# Patient Record
Sex: Female | Born: 1935 | Race: White | Hispanic: No | Smoking: Never smoker
Health system: Southern US, Community
[De-identification: ages and names within clinical notes are randomized; demographics above are authoritative.]

## PROBLEM LIST (undated history)

## (undated) DIAGNOSIS — I10 Essential (primary) hypertension: Secondary | ICD-10-CM

## (undated) DIAGNOSIS — E785 Hyperlipidemia, unspecified: Secondary | ICD-10-CM

## (undated) DIAGNOSIS — E079 Disorder of thyroid, unspecified: Secondary | ICD-10-CM

## (undated) HISTORY — PX: ABDOMINAL HYSTERECTOMY: SHX81

## (undated) HISTORY — PX: HERNIA REPAIR: SHX51

## (undated) HISTORY — PX: CORONARY ANGIOPLASTY WITH STENT PLACEMENT: SHX49

---

## 2015-08-14 ENCOUNTER — Emergency Department
Admission: EM | Admit: 2015-08-14 | Discharge: 2015-08-14 | Disposition: A | Discharge status: Home / Self Care | Payer: Medicare HMO | Attending: Emergency Medicine | Admitting: Emergency Medicine

## 2015-08-14 DIAGNOSIS — S81811A Laceration without foreign body, right lower leg, initial encounter: Secondary | ICD-10-CM | POA: Insufficient documentation

## 2015-08-14 DIAGNOSIS — S81851A Open bite, right lower leg, initial encounter: Secondary | ICD-10-CM | POA: Diagnosis present

## 2015-08-14 DIAGNOSIS — E785 Hyperlipidemia, unspecified: Secondary | ICD-10-CM | POA: Insufficient documentation

## 2015-08-14 DIAGNOSIS — Z23 Encounter for immunization: Secondary | ICD-10-CM | POA: Insufficient documentation

## 2015-08-14 DIAGNOSIS — Y929 Unspecified place or not applicable: Secondary | ICD-10-CM | POA: Diagnosis not present

## 2015-08-14 DIAGNOSIS — I10 Essential (primary) hypertension: Secondary | ICD-10-CM | POA: Insufficient documentation

## 2015-08-14 DIAGNOSIS — IMO0002 Reserved for concepts with insufficient information to code with codable children: Secondary | ICD-10-CM

## 2015-08-14 DIAGNOSIS — Y939 Activity, unspecified: Secondary | ICD-10-CM | POA: Insufficient documentation

## 2015-08-14 DIAGNOSIS — W540XXA Bitten by dog, initial encounter: Secondary | ICD-10-CM | POA: Insufficient documentation

## 2015-08-14 DIAGNOSIS — Y999 Unspecified external cause status: Secondary | ICD-10-CM | POA: Diagnosis not present

## 2015-08-14 HISTORY — DX: Hyperlipidemia, unspecified: E78.5

## 2015-08-14 HISTORY — DX: Disorder of thyroid, unspecified: E07.9

## 2015-08-14 HISTORY — DX: Essential (primary) hypertension: I10

## 2015-08-14 MED ORDER — TETANUS-DIPHTH-ACELL PERTUSSIS 5-2.5-18.5 LF-MCG/0.5 IM SUSP
0.5000 mL | Freq: Once | INTRAMUSCULAR | Status: AC
  Administered 2015-08-14: 0.5 mL via INTRAMUSCULAR
  Filled 2015-08-14: qty 0.5

## 2015-08-14 MED ORDER — AMOXICILLIN-POT CLAVULANATE 500-125 MG PO TABS
1.0000 | ORAL_TABLET | Freq: Once | ORAL | Status: AC
  Administered 2015-08-14: 500 mg via ORAL
  Filled 2015-08-14: qty 1

## 2015-08-14 MED ORDER — BACITRACIN ZINC 500 UNIT/GM EX OINT
TOPICAL_OINTMENT | Freq: Once | CUTANEOUS | Status: AC
  Administered 2015-08-14: 1 via TOPICAL

## 2015-08-14 MED ORDER — BACITRACIN ZINC 500 UNIT/GM EX OINT
TOPICAL_OINTMENT | CUTANEOUS | Status: AC
  Administered 2015-08-14: 1 via TOPICAL
  Filled 2015-08-14: qty 0.9

## 2015-08-14 MED ORDER — AMOXICILLIN-POT CLAVULANATE 500-125 MG PO TABS: 500.0000 mg | ORAL_TABLET | Freq: Three times a day (TID) | ORAL | Status: DC

## 2015-08-14 NOTE — Discharge Instructions (Signed)
Continue to change dressing at least daily and follow up with your primary physician early next week for further evaluation. Return to the emergency room for any concerns. Watch for signs of infection.

## 2015-08-14 NOTE — ED Notes (Signed)
Pt states she was bitten by her grand daughters dog on the back of the right ankle/lower leg today..Marland Kitchen

## 2015-08-14 NOTE — ED Provider Notes (Signed)
[facility] Emergency Department Provider Note  ____________________________________________  Time seen: Approximately 7:52 PM  I have reviewed the triage vital signs and the nursing notes.   HISTORY  Chief Complaint Animal Bite    HPI Donna Montes is a 81 y.o. female who was bitten by a family dog 2 hours ago in the back of her right leg. The dog is up-to-date on shots. She has mild pain. Does not remember her last tetanus. She lives in [place]. Does not have diabetes.   Past Medical History  Diagnosis Date  . Hypertension   . Hyperlipidemia   . Thyroid disease     There are no active problems to display for this patient.   Past Surgical History  Procedure Laterality Date  . Coronary angioplasty with stent placement    . Abdominal hysterectomy      Current Outpatient Rx  Name  Route  Sig  Dispense  Refill  . amoxicillin-clavulanate (AUGMENTIN) 500-125 MG tablet   Oral   Take 1 tablet (500 mg total) by mouth 3 (three) times daily.   21 tablet   0     Allergies Codeine  No family history on file.  Social History Social History  Substance Use Topics  . Smoking status: Never Smoker   . Smokeless tobacco: None  . Alcohol Use: No    Review of Systems Constitutional: No fever/chills Eyes: No visual changes. ENT: No sore throat. Cardiovascular: Denies chest pain. Respiratory: Denies shortness of breath. Gastrointestinal: No abdominal pain.   Skin: per hpi Neurological: Negative for headaches, focal weakness or numbness. 10-point ROS otherwise negative.  ____________________________________________   PHYSICAL EXAM:  VITAL SIGNS: ED Triage Vitals  Enc Vitals Group     BP 08/14/15 1822 159/77 mmHg     Pulse Rate 08/14/15 1822 67     Resp 08/14/15 1822 18     Temp 08/14/15 1822 97.7 F (36.5 C)     Temp Source 08/14/15 1822 Oral     SpO2 08/14/15 1822 96 %     Weight 08/14/15 1822 163 lb (73.936 kg)     Height 08/14/15  1822  (1.6 m)     Head Cir --      Peak Flow --      Pain Score 08/14/15 1900 1     Pain Loc --      Pain Edu? --      Excl. in GC? --     Constitutional: Alert and oriented. Well appearing and in no acute distress. Eyes: Conjunctivae are normal. Mouth/Throat: Mucous membranes are moist.  Cardiovascular: Normal rate, regular rhythm. Grossly normal heart sounds.  Good peripheral circulation. Respiratory: Normal respiratory effort.  No retractions. Lungs CTAB. Neurologic:  Normal speech and language. No gross focal neurologic deficits are appreciated. No gait instability. Skin:  Skin is warm, dry and intact. 2 x 1 cm wound to the right posterior distal shin. Some tenderness and subq fat visualized. No bleeding. Psychiatric: Mood and affect are normal. Speech and behavior are normal.  ____________________________________________   LABS (all labs ordered are listed, but only abnormal results are displayed)  Labs Reviewed - No data to display ____________________________________________  EKG   ____________________________________________  RADIOLOGY   ____________________________________________   PROCEDURES  Procedure(s) performed: Because of the width of the wound, closure was not attempted. Will need to heal by secondary closure. Area cleansed with saline, covered with bacitracin and Vaseline gauze. Also wrapped with dry gauze and Coban.  Critical Care performed: No  ____________________________________________   INITIAL IMPRESSION / ASSESSMENT AND PLAN / ED COURSE  Pertinent labs & imaging results that were available during my care of the patient were reviewed by me and considered in my medical decision making (see chart for details).  81 year old female presents with a dog bite, family pet, up-to-date on immunizations. Wound to the right posterior lower extremity. Wound left open as suturing was not possible. Given tetanus shot. Started on Augmentin. Encouraged  daily dressing changes. In follow-up with her physician early next week for further evaluation. Her neighbor is a Engineer, civil (consulting)nurse who can help with dressing changes. Discussed signs of infection to watch for. ____________________________________________   FINAL CLINICAL IMPRESSION(S) / ED DIAGNOSES  Final diagnoses:  Dog bite  Laceration      Ignacia BayleyRobert Wymon Swaney, PA-C 08/14/15 2016  Jeanmarie PlantJames A McShane, MD 08/14/15 [phone]

## 2015-08-14 NOTE — ED Notes (Signed)
Pt has 1" bite to posterior aspect of R leg. Pt sts dog's shots up to date.

## 2015-08-14 NOTE — ED Notes (Signed)
E sig pad not working, pt verbalized understanding 

## 2018-12-31 ENCOUNTER — Emergency Department: Payer: Medicare HMO

## 2018-12-31 ENCOUNTER — Inpatient Hospital Stay
Admission: EM | Admit: 2018-12-31 | Discharge: 2019-01-03 | DRG: 368 | Disposition: A | Discharge status: Home Health | Payer: Medicare HMO | Attending: Specialist | Admitting: Specialist

## 2018-12-31 ENCOUNTER — Encounter: Payer: Self-pay | Admitting: Emergency Medicine

## 2018-12-31 ENCOUNTER — Other Ambulatory Visit: Payer: Self-pay

## 2018-12-31 DIAGNOSIS — Z955 Presence of coronary angioplasty implant and graft: Secondary | ICD-10-CM | POA: Diagnosis not present

## 2018-12-31 DIAGNOSIS — Z79899 Other long term (current) drug therapy: Secondary | ICD-10-CM | POA: Diagnosis not present

## 2018-12-31 DIAGNOSIS — I5032 Chronic diastolic (congestive) heart failure: Secondary | ICD-10-CM | POA: Diagnosis present

## 2018-12-31 DIAGNOSIS — D649 Anemia, unspecified: Secondary | ICD-10-CM | POA: Diagnosis present

## 2018-12-31 DIAGNOSIS — Z7983 Long term (current) use of bisphosphonates: Secondary | ICD-10-CM | POA: Diagnosis not present

## 2018-12-31 DIAGNOSIS — R109 Unspecified abdominal pain: Secondary | ICD-10-CM | POA: Diagnosis not present

## 2018-12-31 DIAGNOSIS — Z20828 Contact with and (suspected) exposure to other viral communicable diseases: Secondary | ICD-10-CM | POA: Diagnosis present

## 2018-12-31 DIAGNOSIS — E785 Hyperlipidemia, unspecified: Secondary | ICD-10-CM | POA: Diagnosis present

## 2018-12-31 DIAGNOSIS — I110 Hypertensive heart disease with heart failure: Secondary | ICD-10-CM | POA: Diagnosis present

## 2018-12-31 DIAGNOSIS — K859 Acute pancreatitis without necrosis or infection, unspecified: Secondary | ICD-10-CM

## 2018-12-31 DIAGNOSIS — Z90710 Acquired absence of both cervix and uterus: Secondary | ICD-10-CM

## 2018-12-31 DIAGNOSIS — Z885 Allergy status to narcotic agent status: Secondary | ICD-10-CM | POA: Diagnosis not present

## 2018-12-31 DIAGNOSIS — D693 Immune thrombocytopenic purpura: Secondary | ICD-10-CM | POA: Diagnosis present

## 2018-12-31 DIAGNOSIS — Z79890 Hormone replacement therapy (postmenopausal): Secondary | ICD-10-CM | POA: Diagnosis not present

## 2018-12-31 DIAGNOSIS — K449 Diaphragmatic hernia without obstruction or gangrene: Secondary | ICD-10-CM | POA: Diagnosis present

## 2018-12-31 DIAGNOSIS — D696 Thrombocytopenia, unspecified: Secondary | ICD-10-CM | POA: Diagnosis not present

## 2018-12-31 DIAGNOSIS — K2101 Gastro-esophageal reflux disease with esophagitis, with bleeding: Principal | ICD-10-CM | POA: Diagnosis present

## 2018-12-31 DIAGNOSIS — M810 Age-related osteoporosis without current pathological fracture: Secondary | ICD-10-CM | POA: Diagnosis present

## 2018-12-31 DIAGNOSIS — E039 Hypothyroidism, unspecified: Secondary | ICD-10-CM | POA: Diagnosis present

## 2018-12-31 DIAGNOSIS — R748 Abnormal levels of other serum enzymes: Secondary | ICD-10-CM | POA: Diagnosis present

## 2018-12-31 DIAGNOSIS — R1013 Epigastric pain: Secondary | ICD-10-CM | POA: Diagnosis present

## 2018-12-31 DIAGNOSIS — K219 Gastro-esophageal reflux disease without esophagitis: Secondary | ICD-10-CM | POA: Diagnosis not present

## 2018-12-31 DIAGNOSIS — K2211 Ulcer of esophagus with bleeding: Secondary | ICD-10-CM | POA: Diagnosis present

## 2018-12-31 DIAGNOSIS — R9431 Abnormal electrocardiogram [ECG] [EKG]: Secondary | ICD-10-CM | POA: Diagnosis present

## 2018-12-31 DIAGNOSIS — I10 Essential (primary) hypertension: Secondary | ICD-10-CM

## 2018-12-31 DIAGNOSIS — Z888 Allergy status to other drugs, medicaments and biological substances status: Secondary | ICD-10-CM | POA: Diagnosis not present

## 2018-12-31 DIAGNOSIS — I2510 Atherosclerotic heart disease of native coronary artery without angina pectoris: Secondary | ICD-10-CM | POA: Diagnosis present

## 2018-12-31 LAB — CBC WITH DIFFERENTIAL/PLATELET
Abs Immature Granulocytes: 0.04 10*3/uL (ref 0.00–0.07)
Basophils Absolute: 0.1 10*3/uL (ref 0.0–0.1)
Basophils Relative: 1 %
Eosinophils Absolute: 0.1 10*3/uL (ref 0.0–0.5)
Eosinophils Relative: 1 %
HCT: 33.4 % — ABNORMAL LOW (ref 36.0–46.0)
Hemoglobin: 10.6 g/dL — ABNORMAL LOW (ref 12.0–15.0)
Immature Granulocytes: 1 %
Lymphocytes Relative: 18 %
Lymphs Abs: 1.6 10*3/uL (ref 0.7–4.0)
MCH: 29.4 pg (ref 26.0–34.0)
MCHC: 31.7 g/dL (ref 30.0–36.0)
MCV: 92.5 fL (ref 80.0–100.0)
Monocytes Absolute: 0.7 10*3/uL (ref 0.1–1.0)
Monocytes Relative: 8 %
Neutro Abs: 6.2 10*3/uL (ref 1.7–7.7)
Neutrophils Relative %: 71 %
Platelets: 36 10*3/uL — ABNORMAL LOW (ref 150–400)
RBC: 3.61 MIL/uL — ABNORMAL LOW (ref 3.87–5.11)
RDW: 15.4 % (ref 11.5–15.5)
WBC: 8.7 10*3/uL (ref 4.0–10.5)
nRBC: 0 % (ref 0.0–0.2)

## 2018-12-31 LAB — COMPREHENSIVE METABOLIC PANEL
ALT: 10 U/L (ref 0–44)
AST: 15 U/L (ref 15–41)
Albumin: 3.7 g/dL (ref 3.5–5.0)
Alkaline Phosphatase: 85 U/L (ref 38–126)
Anion gap: 8 (ref 5–15)
BUN: 22 mg/dL (ref 8–23)
CO2: 25 mmol/L (ref 22–32)
Calcium: 8.6 mg/dL — ABNORMAL LOW (ref 8.9–10.3)
Chloride: 108 mmol/L (ref 98–111)
Creatinine, Ser: 1.09 mg/dL — ABNORMAL HIGH (ref 0.44–1.00)
GFR calc Af Amer: 55 mL/min — ABNORMAL LOW (ref >60)
GFR calc non Af Amer: 47 mL/min — ABNORMAL LOW (ref >60)
Glucose, Bld: 111 mg/dL — ABNORMAL HIGH (ref 70–99)
Potassium: 3.5 mmol/L (ref 3.5–5.1)
Sodium: 141 mmol/L (ref 135–145)
Total Bilirubin: 0.6 mg/dL (ref 0.3–1.2)
Total Protein: 6.6 g/dL (ref 6.5–8.1)

## 2018-12-31 LAB — FERRITIN: Ferritin: 35 ng/mL (ref 11–307)

## 2018-12-31 LAB — LIPASE, BLOOD: Lipase: 60 U/L — ABNORMAL HIGH (ref 11–51)

## 2018-12-31 LAB — VITAMIN B12: Vitamin B-12: 669 pg/mL (ref 180–914)

## 2018-12-31 LAB — IRON AND TIBC
Iron: 228 ug/dL — ABNORMAL HIGH (ref 28–170)
Saturation Ratios: 80 % — ABNORMAL HIGH (ref 10.4–31.8)
TIBC: 284 ug/dL (ref 250–450)
UIBC: 56 ug/dL

## 2018-12-31 LAB — LACTATE DEHYDROGENASE: LDH: 101 U/L (ref 98–192)

## 2018-12-31 LAB — TSH: TSH: 15.363 u[IU]/mL — ABNORMAL HIGH (ref 0.350–4.500)

## 2018-12-31 LAB — SARS CORONAVIRUS 2 (TAT 6-24 HRS): SARS Coronavirus 2: NEGATIVE

## 2018-12-31 LAB — TROPONIN I (HIGH SENSITIVITY)
Troponin I (High Sensitivity): 6 ng/L (ref <18)
Troponin I (High Sensitivity): 6 ng/L (ref <18)

## 2018-12-31 LAB — FOLATE: Folate: 13.8 ng/mL (ref >5.9)

## 2018-12-31 LAB — MRSA PCR SCREENING: MRSA by PCR: NEGATIVE

## 2018-12-31 MED ORDER — LABETALOL HCL 200 MG PO TABS
200.0000 mg | ORAL_TABLET | Freq: Three times a day (TID) | ORAL | Status: DC
  Administered 2018-12-31 – 2019-01-03 (×7): 200 mg via ORAL
  Filled 2018-12-31 (×11): qty 1

## 2018-12-31 MED ORDER — PRAVASTATIN SODIUM 20 MG PO TABS
40.0000 mg | ORAL_TABLET | Freq: Every day | ORAL | Status: DC
  Administered 2018-12-31 – 2019-01-02 (×3): 40 mg via ORAL
  Filled 2018-12-31 (×3): qty 2

## 2018-12-31 MED ORDER — CITALOPRAM HYDROBROMIDE 20 MG PO TABS: 40.0000 mg | ORAL_TABLET | Freq: Every day | ORAL | Status: DC

## 2018-12-31 MED ORDER — ACETAMINOPHEN 650 MG RE SUPP: 650.0000 mg | Freq: Four times a day (QID) | RECTAL | Status: DC | PRN

## 2018-12-31 MED ORDER — SODIUM CHLORIDE 0.9 % IV SOLN
INTRAVENOUS | Status: DC
  Administered 2018-12-31 – 2019-01-02 (×5): via INTRAVENOUS

## 2018-12-31 MED ORDER — METHOCARBAMOL 500 MG PO TABS
500.0000 mg | ORAL_TABLET | Freq: Four times a day (QID) | ORAL | Status: DC
  Administered 2018-12-31 – 2019-01-03 (×9): 500 mg via ORAL
  Filled 2018-12-31 (×15): qty 1

## 2018-12-31 MED ORDER — LISINOPRIL 5 MG PO TABS
5.0000 mg | ORAL_TABLET | Freq: Every day | ORAL | Status: DC
  Administered 2018-12-31 – 2019-01-02 (×3): 5 mg via ORAL
  Filled 2018-12-31 (×2): qty 1

## 2018-12-31 MED ORDER — ALUM & MAG HYDROXIDE-SIMETH 200-200-20 MG/5ML PO SUSP
30.0000 mL | Freq: Once | ORAL | Status: AC
  Administered 2018-12-31: 07:00:00 30 mL via ORAL
  Filled 2018-12-31: qty 30

## 2018-12-31 MED ORDER — LEVOTHYROXINE SODIUM 50 MCG PO TABS
75.0000 ug | ORAL_TABLET | Freq: Every day | ORAL | Status: DC
  Administered 2019-01-01 – 2019-01-03 (×2): 75 ug via ORAL
  Filled 2018-12-31 (×2): qty 1

## 2018-12-31 MED ORDER — DICLOFENAC SODIUM 1 % TD GEL
4.0000 g | Freq: Four times a day (QID) | TRANSDERMAL | Status: DC
  Administered 2018-12-31 – 2019-01-03 (×10): 4 g via TOPICAL
  Filled 2018-12-31: qty 100

## 2018-12-31 MED ORDER — TORSEMIDE 20 MG PO TABS
20.0000 mg | ORAL_TABLET | ORAL | Status: DC
  Administered 2019-01-01 – 2019-01-03 (×2): 20 mg via ORAL
  Filled 2018-12-31 (×2): qty 1

## 2018-12-31 MED ORDER — TRAMADOL HCL 50 MG PO TABS
50.0000 mg | ORAL_TABLET | Freq: Four times a day (QID) | ORAL | Status: DC | PRN
  Administered 2018-12-31 – 2019-01-03 (×8): 50 mg via ORAL
  Filled 2018-12-31 (×8): qty 1

## 2018-12-31 MED ORDER — LABETALOL HCL 200 MG PO TABS
200.0000 mg | ORAL_TABLET | Freq: Once | ORAL | Status: AC
  Administered 2018-12-31: 05:00:00 200 mg via ORAL
  Filled 2018-12-31: qty 1

## 2018-12-31 MED ORDER — ONDANSETRON HCL 4 MG/2ML IJ SOLN
4.0000 mg | Freq: Once | INTRAMUSCULAR | Status: AC
  Administered 2018-12-31: 07:00:00 4 mg via INTRAVENOUS
  Filled 2018-12-31: qty 2

## 2018-12-31 MED ORDER — ONDANSETRON HCL 4 MG/2ML IJ SOLN
4.0000 mg | Freq: Four times a day (QID) | INTRAMUSCULAR | Status: DC | PRN
  Administered 2019-01-03: 10:00:00 4 mg via INTRAVENOUS
  Filled 2018-12-31: qty 2

## 2018-12-31 MED ORDER — PANTOPRAZOLE SODIUM 40 MG PO TBEC
40.0000 mg | DELAYED_RELEASE_TABLET | Freq: Every day | ORAL | Status: DC
  Administered 2018-12-31 – 2019-01-03 (×3): 40 mg via ORAL
  Filled 2018-12-31 (×2): qty 1

## 2018-12-31 MED ORDER — TEMAZEPAM 15 MG PO CAPS
15.0000 mg | ORAL_CAPSULE | Freq: Every evening | ORAL | Status: DC | PRN
  Administered 2019-01-01 – 2019-01-02 (×2): 15 mg via ORAL
  Filled 2018-12-31 (×6): qty 1

## 2018-12-31 MED ORDER — HYDRALAZINE HCL 20 MG/ML IJ SOLN
5.0000 mg | Freq: Once | INTRAMUSCULAR | Status: AC
  Administered 2018-12-31: 07:00:00 5 mg via INTRAVENOUS
  Filled 2018-12-31: qty 1

## 2018-12-31 MED ORDER — ACETAMINOPHEN 325 MG PO TABS
650.0000 mg | ORAL_TABLET | Freq: Four times a day (QID) | ORAL | Status: DC | PRN
  Administered 2018-12-31 – 2019-01-02 (×3): 650 mg via ORAL
  Filled 2018-12-31 (×3): qty 2

## 2018-12-31 MED ORDER — LIDOCAINE VISCOUS HCL 2 % MT SOLN
15.0000 mL | Freq: Once | OROMUCOSAL | Status: AC
  Administered 2018-12-31: 15 mL via ORAL
  Filled 2018-12-31: qty 15

## 2018-12-31 MED ORDER — FENTANYL CITRATE (PF) 100 MCG/2ML IJ SOLN
50.0000 ug | Freq: Once | INTRAMUSCULAR | Status: AC
  Administered 2018-12-31: 50 ug via INTRAVENOUS
  Filled 2018-12-31: qty 2

## 2018-12-31 MED ORDER — ASPIRIN 81 MG PO CHEW
324.0000 mg | CHEWABLE_TABLET | Freq: Once | ORAL | Status: AC
  Administered 2018-12-31: 324 mg via ORAL
  Filled 2018-12-31: qty 4

## 2018-12-31 MED ORDER — ONDANSETRON HCL 4 MG PO TABS: 4.0000 mg | ORAL_TABLET | Freq: Four times a day (QID) | ORAL | Status: DC | PRN

## 2018-12-31 MED ORDER — FERROUS SULFATE 325 (65 FE) MG PO TABS
325.0000 mg | ORAL_TABLET | Freq: Every day | ORAL | Status: DC
  Administered 2018-12-31 – 2019-01-03 (×3): 325 mg via ORAL
  Filled 2018-12-31 (×3): qty 1

## 2018-12-31 MED ORDER — DOCUSATE SODIUM 100 MG PO CAPS
100.0000 mg | ORAL_CAPSULE | Freq: Two times a day (BID) | ORAL | Status: DC
  Administered 2018-12-31 – 2019-01-02 (×5): 100 mg via ORAL
  Filled 2018-12-31 (×5): qty 1

## 2018-12-31 MED ORDER — FAMOTIDINE IN NACL 20-0.9 MG/50ML-% IV SOLN
20.0000 mg | Freq: Once | INTRAVENOUS | Status: AC
  Administered 2018-12-31: 20 mg via INTRAVENOUS
  Filled 2018-12-31: qty 50

## 2018-12-31 MED ORDER — IOHEXOL 300 MG/ML  SOLN
100.0000 mL | Freq: Once | INTRAMUSCULAR | Status: AC | PRN
  Administered 2018-12-31: 03:00:00 100 mL via INTRAVENOUS

## 2018-12-31 MED ORDER — CALCIUM CARBONATE-VITAMIN D 500-200 MG-UNIT PO TABS
ORAL_TABLET | Freq: Every day | ORAL | Status: DC
  Administered 2018-12-31 – 2019-01-03 (×3): 1 via ORAL
  Filled 2018-12-31 (×3): qty 1

## 2018-12-31 MED ORDER — LISINOPRIL 5 MG PO TABS
5.0000 mg | ORAL_TABLET | Freq: Once | ORAL | Status: AC
  Administered 2018-12-31: 05:00:00 5 mg via ORAL
  Filled 2018-12-31: qty 1

## 2018-12-31 MED ORDER — VITAMIN D 25 MCG (1000 UNIT) PO TABS
2000.0000 [IU] | ORAL_TABLET | Freq: Every day | ORAL | Status: DC
  Administered 2018-12-31 – 2019-01-03 (×3): 2000 [IU] via ORAL
  Filled 2018-12-31 (×5): qty 2

## 2018-12-31 NOTE — ED Notes (Signed)
Pt given warm blankets.

## 2018-12-31 NOTE — ED Provider Notes (Addendum)
[facility] Emergency Department Provider Note  ____________________________________________  Time seen: Approximately 1:45 AM  I have reviewed the triage vital signs and the nursing notes.   HISTORY  Chief Complaint Chest Pain   HPI Donna Montes is a 84 y.o. female with a history of hypothyroidism, hypertension, hyperlipidemia, CAD status post stent placement, diastolic CHF, new thrombocytopenia, GERD, paraesophageal hernia status post repair 2018 who presents for evaluation  of epigastric abdominal pain.  Patient reports being in his usual state of health when she went to sleep this evening.  At 10:15 PM she woke up with intermittent sharp epigastric abdominal pain.  No pain at this time but when the pain comes on it is 9 out of 10.  Nonradiating.  No shortness of breath, no nausea, no vomiting, no diarrhea, no constipation, no cough, no fever, no chest pain, no back pain.  Patient reports 1 prior episode of similar pain in the setting of a hiatal hernia which was repaired in 2018.  She denies any personal or family history of blood clots, recent travel or immobilization, leg pain or swelling, hemoptysis, exogenous hormones, or history of cancer.  Patient received 3 nitros per EMS with no changes in her pain.  Past Medical History:  Diagnosis Date  . Hyperlipidemia   . Hypertension   . Thyroid disease      Past Surgical History:  Procedure Laterality Date  . ABDOMINAL HYSTERECTOMY    . CORONARY ANGIOPLASTY WITH STENT PLACEMENT      Prior to Admission medications   Medication Sig Start Date End Date Taking? Authorizing Provider  amoxicillin-clavulanate (AUGMENTIN) 500-125 MG tablet Take 1 tablet (500 mg total) by mouth 3 (three) times daily. 08/14/15   Ignacia Bayleyumey, Robert, PA-C    Allergies Codeine  History reviewed. No pertinent family history.  Social History Social History   Tobacco Use  . Smoking status: Never Smoker  . Smokeless tobacco: Never Used   Substance Use Topics  . Alcohol use: No  . Drug use: Not on file    Review of Systems  Constitutional: Negative for fever. Eyes: Negative for visual changes. ENT: Negative for sore throat. Neck: No neck pain  Cardiovascular: Negative for chest pain. Respiratory: Negative for shortness of breath. Gastrointestinal: + epigastric abdominal pain. No vomiting or diarrhea. Genitourinary: Negative for dysuria. Musculoskeletal: Negative for back pain. Skin: Negative for rash. Neurological: Negative for headaches, weakness or numbness. Psych: No SI or HI  ____________________________________________   PHYSICAL EXAM:  VITAL SIGNS: ED Triage Vitals  Enc Vitals Group     BP 12/31/18 0203 (!) 173/79     Pulse Rate 12/31/18 0135 70     Resp 12/31/18 0135 17     Temp 12/31/18 0135 98.2 F (36.8 C)     Temp Source 12/31/18 0135 Oral     SpO2 12/31/18 0133 100 %     Weight 12/31/18 0134 157 lb (71.2 kg)     Height 12/31/18 0134 5\' 1"  (1.549 m)     Head Circumference --      Peak Flow --      Pain Score 12/31/18 0133 9     Pain Loc --      Pain Edu? --      Excl. in GC? --     Constitutional: Alert and oriented. Well appearing and in no apparent distress. HEENT:      Head: Normocephalic and atraumatic.         Eyes: Conjunctivae are normal.  Sclera is non-icteric.       Mouth/Throat: Mucous membranes are moist.       Neck: Supple with no signs of meningismus. Cardiovascular: Regular rate and rhythm. No murmurs, gallops, or rubs. 2+ symmetrical distal pulses are present in all extremities. No JVD. Respiratory: Normal respiratory effort. Lungs are clear to auscultation bilaterally. No wheezes, crackles, or rhonchi.  Gastrointestinal: Soft, tender to palpation over the epigastric region, no RUQ tenderness, and non distended with positive bowel sounds. No rebound or guarding. Genitourinary: No CVA tenderness. Musculoskeletal: Nontender with normal range of motion in all extremities.  No edema, cyanosis, or erythema of extremities. Neurologic: Normal speech and language. Face is symmetric. Moving all extremities. No gross focal neurologic deficits are appreciated. Skin: Skin is warm, dry and intact. No rash noted. Psychiatric: Mood and affect are normal. Speech and behavior are normal.  ____________________________________________   LABS (all labs ordered are listed, but only abnormal results are displayed)  Labs Reviewed  CBC WITH DIFFERENTIAL/PLATELET - Abnormal; Notable for the following components:      Result Value   RBC 3.61 (*)    Hemoglobin 10.6 (*)    HCT 33.4 (*)    Platelets 36 (*)    All other components within normal limits  COMPREHENSIVE METABOLIC PANEL - Abnormal; Notable for the following components:   Glucose, Bld 111 (*)    Creatinine, Ser 1.09 (*)    Calcium 8.6 (*)    GFR calc non Af Amer 47 (*)    GFR calc Af Amer 55 (*)    All other components within normal limits  LIPASE, BLOOD - Abnormal; Notable for the following components:   Lipase 60 (*)    All other components within normal limits  TROPONIN I (HIGH SENSITIVITY)  TROPONIN I (HIGH SENSITIVITY)   ____________________________________________  EKG  ED ECG REPORT I, Nita Sickle, the attending physician, personally viewed and interpreted this ECG.  Normal sinus rhythm, rate of 68, borderline prolonged QTC, LVH, normal axis, no ST elevations or depressions, diffuse T wave flattening.  No prior for comparison.  06:17 -normal sinus rhythm, rate of 64, prolonged QTC, LVH, normal axis, no ST elevations or depressions.  Unchanged from initial. ____________________________________________  RADIOLOGY  I have personally reviewed the images performed during this visit and I agree with the Radiologist's read.   Interpretation by Radiologist:  Ct Abdomen Pelvis W Contrast  Result Date: 12/31/2018 CLINICAL DATA:  Epigastric abdominal pain. EXAM: CT ABDOMEN AND PELVIS WITH CONTRAST  TECHNIQUE: Multidetector CT imaging of the abdomen and pelvis was performed using the standard protocol following bolus administration of intravenous contrast. CONTRAST:  OMNIPAQUE IOHEXOL 300 MG/ML  SOLN COMPARISON:  Radiographs earlier this day. Right upper quadrant ultrasound 12/14/2018. CT 05/01/2016 FINDINGS: Lower chest: Minor lung base atelectasis. Decreased size of hiatal hernia from prior exam, no small in size. Upper normal heart size. Hepatobiliary: No focal liver abnormality is seen. No gallstones, gallbladder wall thickening, or biliary dilatation. Pancreas: No ductal dilatation or inflammation. Spleen: Normal in size without focal abnormality. Adrenals/Urinary Tract: Normal adrenal glands. No hydronephrosis or perinephric edema. Homogeneous renal enhancement with symmetric excretion on delayed phase imaging. Mild bilateral renal parenchymal thinning. Urinary bladder is physiologically distended without wall thickening. Tiny focus of air in the urinary bladder may be iatrogenic. Stomach/Bowel: Reduction in size of previous large hiatal hernia, postsurgical change the gastroesophageal junction. Small residual hiatal hernia. Majority of the stomach is now intra-abdominal. No small bowel wall thickening, obstruction, or inflammation.  Normal appendix. Moderate colonic stool burden. Colonic diverticulosis, prominent in the sigmoid colon. No diverticulitis. Vascular/Lymphatic: Aorto bi-iliac atherosclerosis. No aneurysm. Prominent 7 mm right external iliac node is nonspecific. No enlarged lymph nodes in the abdomen or pelvis. Reproductive: Uterus is surgically absent. Ovaries tentatively identified and quiescent. No adnexal mass. Other: No free air, free fluid, or intra-abdominal fluid collection. Musculoskeletal: Degenerative anterolisthesis of L4 on L5 has slightly increased from 2018 CT. Degenerative change in the right hip. Unchanged T9 vertebral body hemangioma. No acute osseous abnormalities.  IMPRESSION: 1. No acute abnormality or explanation for abdominal pain. 2. Colonic diverticulosis without diverticulitis. Moderate colonic stool burden. 3. Prior hiatal hernia repair, small residual or recurrent hiatal hernia. Aortic Atherosclerosis (ICD10-I70.0). Electronically Signed   By: Keith Rake M.D.   On: 12/31/2018 03:39   Dg Abdomen Acute W/chest  Result Date: 12/31/2018 CLINICAL DATA:  Epigastric pain. EXAM: DG ABDOMEN ACUTE W/ 1V CHEST COMPARISON:  Report from chest radiograph 05/10/2009, images not available. FINDINGS: Small metallic densities at the left lung base with adjacent linear densities, presumably scarring and chronic. No acute airspace disease. Heart is normal in size with normal mediastinal contours. No pleural effusion or pulmonary edema. No free intra-abdominal air. No bowel dilatation to suggest obstruction. Small volume of colonic stool. Multiple pelvic calcifications are most consistent with phleboliths. No evidence of radiopaque calculi. Mild scoliosis. Osteoarthritis of the right hip. IMPRESSION: 1. Left lung base scarring. 2. Normal bowel gas pattern. No free air. Electronically Signed   By: Keith Rake M.D.   On: 12/31/2018 02:25     ____________________________________________   PROCEDURES  Procedure(s) performed: None Procedures Critical Care performed:  None ____________________________________________   INITIAL IMPRESSION / ASSESSMENT AND PLAN / ED COURSE  84 y.o. female with a history of hypothyroidism, hypertension, hyperlipidemia, CAD status post stent placement, diastolic CHF, new thrombocytopenia, GERD, paraesophageal hernia status post repair 2018 who presents for evaluation  of sudden onset intermittent sharp epigastric abdominal pain since 10PM. No associated symptoms.  Patient is well-appearing in no distress, has normal vital signs, mild epigastric tenderness on palpation with no rebound or guarding, no right upper quadrant tenderness,  positive bowel sounds with no distention.  EKG showing no ischemic changes.  Differential diagnoses including hiatal hernia, GERD, peptic ulcer disease, gastritis, pancreatitis, gallbladder disease, SBO, ACS.  Less likely to be PE in the setting of intermittent pain, no tachycardia, no tachypnea, no hypoxia.  Plan for labs, x-ray.  Will monitor closely.  Will give aspirin.    _________________________ 6:11 AM on 12/31/2018 -----------------------------------------  Patient is now complaining of 10/10 pain again in the epigastric region and is tender to palpation. With labs showing elevated lipase, possibly pancreatitis. Due to history of CAD, repeat EKG was done which showed no ischemic changes. Troponin x2 is negative.  CBC showing stable thrombocytopenia and anemia.  Patient is undergoing extensive evaluation by her PCP and has been referred to hematology for further evaluation of these findings since recent admission almost 3 weeks ago.  Labs showing stable creatinine with no significant electrolyte abnormalities.  CT does show very small recurrent or residual paraesophageal hernia which could also be the cause of her symptoms. Patient's BP elevated during her stay in the ED. Patient was asymptomatic and given her morning BP pills with no changes in her BP. Patient will be given IV hydralazine. Due to recurrent pain and malignant hypertension, will admit to Hospitalist   _________________________ 6:40 AM on 12/31/2018 -----------------------------------------  Discussed with Dr.  Diamond for admission.  As part of my medical decision making, I reviewed the following data within the electronic MEDICAL RECORD NUMBER Nursing notes reviewed and incorporated, Labs reviewed \, EKG interpreted , Old chart reviewed, Radiograph reviewed , Notes from prior ED visits and [place] Controlled Substance Database   Patient was evaluated in Emergency Department today for the symptoms described in the history of present  illness. Patient was evaluated in the context of the global COVID-19 pandemic, which necessitated consideration that the patient might be at risk for infection with the SARS-CoV-2 virus that causes COVID-19. Institutional protocols and algorithms that pertain to the evaluation of patients at risk for COVID-19 are in a state of rapid change based on information released by regulatory bodies including the CDC and federal and state organizations. These policies and algorithms were followed during the patient's care in the ED.   ____________________________________________   FINAL CLINICAL IMPRESSION(S) / ED DIAGNOSES   Final diagnoses:  Epigastric abdominal pain  Paraesophageal hernia  Acute pancreatitis without infection or necrosis, unspecified pancreatitis type  Malignant hypertension      NEW MEDICATIONS STARTED DURING THIS VISIT:  ED Discharge Orders    None       Note:  This document was prepared using Dragon voice recognition software and may include unintentional dictation errors.    Nita Sickle, MD 12/31/18 [id number]    Nita Sickle, MD 12/31/18 [id number]    Nita Sickle, MD 12/31/18 [phone]

## 2018-12-31 NOTE — Progress Notes (Signed)
[facility] NAME: Donna Montes    MR#:  [id number]  DATE OF BIRTH:  01-18-1935  SUBJECTIVE:   Patient still complaining of some vague abdominal pain.  Also noted to be thrombocytopenic but etiology unclear.  Awaiting GI and oncology evaluation.  No other acute complaints presently.  Patient is hungry and wants to eat.  REVIEW OF SYSTEMS:    Review of Systems  Constitutional: Negative for chills and fever.  HENT: Negative for congestion and tinnitus.   Eyes: Negative for blurred vision and double vision.  Respiratory: Negative for cough, shortness of breath and wheezing.   Cardiovascular: Negative for chest pain, orthopnea and PND.  Gastrointestinal: Positive for abdominal pain. Negative for diarrhea, nausea and vomiting.  Genitourinary: Negative for dysuria and hematuria.  Neurological: Negative for dizziness, sensory change and focal weakness.  All other systems reviewed and are negative.   Nutrition: clear liquid and advance as tolerated.  Tolerating Diet: Yes Tolerating PT: Await Eval.      DRUG ALLERGIES:   Allergies  Allergen Reactions  . Nortriptyline Other (See Comments)    confusion   . Sertraline Other (See Comments)    confusion   . Duloxetine Other (See Comments)    Cant remember unknown   . Gabapentin Other (See Comments)    Muscle weakess- pt states she fell  . Codeine Rash    VITALS:  Blood pressure (!) 184/75, pulse 64, temperature 98.1 F (36.7 C), temperature source Oral, resp. rate 19, height 5\' 1"  (1.549 m), weight 71.2 kg, SpO2 96 %.  PHYSICAL EXAMINATION:   Physical Exam  GENERAL:  84 y.o.-year-old patient lying in bed in no acute distress.  EYES: Pupils equal, round, reactive to light and accommodation. No scleral icterus. Extraocular muscles intact.  HEENT: Head atraumatic, normocephalic. Oropharynx and nasopharynx clear.  NECK:  Supple, no jugular venous distention. No thyroid  enlargement, no tenderness.  LUNGS: Normal breath sounds bilaterally, no wheezing, rales, rhonchi. No use of accessory muscles of respiration.  CARDIOVASCULAR: S1, S2 normal. No murmurs, rubs, or gallops.  ABDOMEN: Soft, nontender, nondistended. Bowel sounds present. No organomegaly or mass.  EXTREMITIES: No cyanosis, clubbing or edema b/l.   Right upper extremity in a cast. NEUROLOGIC: Cranial nerves II through XII are intact. No focal Motor or sensory deficits b/l.   PSYCHIATRIC: The patient is alert and oriented x 3.  SKIN: No obvious rash, lesion, or ulcer.    LABORATORY PANEL:   CBC Recent Labs  Lab 12/31/18 0141  WBC 8.7  HGB 10.6*  HCT 33.4*  PLT 36*   ------------------------------------------------------------------------------------------------------------------  Chemistries  Recent Labs  Lab 12/31/18 0141  NA 141  K 3.5  CL 108  CO2 25  GLUCOSE 111*  BUN 22  CREATININE 1.09*  CALCIUM 8.6*  AST 15  ALT 10  ALKPHOS 85  BILITOT 0.6   ------------------------------------------------------------------------------------------------------------------  Cardiac Enzymes No results for input(s): TROPONINI in the last 168 hours. ------------------------------------------------------------------------------------------------------------------  RADIOLOGY:  Ct Abdomen Pelvis W Contrast  Result Date: 12/31/2018 CLINICAL DATA:  Epigastric abdominal pain. EXAM: CT ABDOMEN AND PELVIS WITH CONTRAST TECHNIQUE: Multidetector CT imaging of the abdomen and pelvis was performed using the standard protocol following bolus administration of intravenous contrast. CONTRAST:  143mL OMNIPAQUE IOHEXOL 300 MG/ML  SOLN COMPARISON:  Radiographs earlier this day. Right upper quadrant ultrasound 12/14/2018. CT 05/01/2016 FINDINGS: Lower chest: Minor lung base atelectasis. Decreased size of hiatal hernia from prior exam, no  small in size. Upper normal heart size. Hepatobiliary: No focal liver  abnormality is seen. No gallstones, gallbladder wall thickening, or biliary dilatation. Pancreas: No ductal dilatation or inflammation. Spleen: Normal in size without focal abnormality. Adrenals/Urinary Tract: Normal adrenal glands. No hydronephrosis or perinephric edema. Homogeneous renal enhancement with symmetric excretion on delayed phase imaging. Mild bilateral renal parenchymal thinning. Urinary bladder is physiologically distended without wall thickening. Tiny focus of air in the urinary bladder may be iatrogenic. Stomach/Bowel: Reduction in size of previous large hiatal hernia, postsurgical change the gastroesophageal junction. Small residual hiatal hernia. Majority of the stomach is now intra-abdominal. No small bowel wall thickening, obstruction, or inflammation. Normal appendix. Moderate colonic stool burden. Colonic diverticulosis, prominent in the sigmoid colon. No diverticulitis. Vascular/Lymphatic: Aorto bi-iliac atherosclerosis. No aneurysm. Prominent 7 mm right external iliac node is nonspecific. No enlarged lymph nodes in the abdomen or pelvis. Reproductive: Uterus is surgically absent. Ovaries tentatively identified and quiescent. No adnexal mass. Other: No free air, free fluid, or intra-abdominal fluid collection. Musculoskeletal: Degenerative anterolisthesis of L4 on L5 has slightly increased from 2018 CT. Degenerative change in the right hip. Unchanged T9 vertebral body hemangioma. No acute osseous abnormalities. IMPRESSION: 1. No acute abnormality or explanation for abdominal pain. 2. Colonic diverticulosis without diverticulitis. Moderate colonic stool burden. 3. Prior hiatal hernia repair, small residual or recurrent hiatal hernia. Aortic Atherosclerosis (ICD10-I70.0). Electronically Signed   By: Narda Rutherford M.D.   On: 12/31/2018 03:39   Dg Abdomen Acute W/chest  Result Date: 12/31/2018 CLINICAL DATA:  Epigastric pain. EXAM: DG ABDOMEN ACUTE W/ 1V CHEST COMPARISON:  Report from  chest radiograph 05/10/2009, images not available. FINDINGS: Small metallic densities at the left lung base with adjacent linear densities, presumably scarring and chronic. No acute airspace disease. Heart is normal in size with normal mediastinal contours. No pleural effusion or pulmonary edema. No free intra-abdominal air. No bowel dilatation to suggest obstruction. Small volume of colonic stool. Multiple pelvic calcifications are most consistent with phleboliths. No evidence of radiopaque calculi. Mild scoliosis. Osteoarthritis of the right hip. IMPRESSION: 1. Left lung base scarring. 2. Normal bowel gas pattern. No free air. Electronically Signed   By: Narda Rutherford M.D.   On: 12/31/2018 02:25     ASSESSMENT AND PLAN:   84 year old female with past medical history of hypertension, hypothyroidism, osteoporosis, hyperlipidemia, GERD who presented to the hospital due to abdominal pain and also noted to be thrombocytopenic.  1.  Abdominal pain-etiology unclear presently. ?? Gastritis or related to her Hiatal Hernia. Patient CT abdomen pelvis was negative for acute pathology. -LFTs and lipase level are fairly normal.  Patient is afebrile with a normal white cell count. -Continue supportive care with pain control, IV fluids.  Patient is hungry and wanting to eat and will start on clear liquids and advance as tolerated.  2.  Thrombocytopenia-etiology unclear.  Patient's previous platelet count last year was in the 200s.  No acute bleeding presently. -Hold DVT prophylaxis.  Await hematology oncology input.  3. Essential hypertension-continue lisinopril, labetalol.    4.  Hypothyroidism-continue Synthroid.  5. GERD - cont. Protonix.   6. Hyperlipidemia - cont. Pravachol.   7. Osteoporosis - cont. Calcium/vit-D supplements.   All the records are reviewed and case discussed with Care Management/Social Worker. Management plans discussed with the patient, family and they are in agreement.  CODE  STATUS: Full code  DVT Prophylaxis: Ted's & SCD's.   TOTAL TIME TAKING CARE OF THIS PATIENT: 30 minutes.   POSSIBLE  D/C IN 1-2 DAYS, DEPENDING ON CLINICAL CONDITION.   Houston SirenVivek J Paelyn Smick M.D on 12/31/2018 at 1:19 PM  Between 7am to 6pm - Pager - [phone]  After 6pm go to [email or web address] - Social research officer, governmentpassword [email or web address] [email or web address]  [facility]  Office  [phone]  CC: Primary care physician; Lester CarolinaMcFadden, John C., MD

## 2018-12-31 NOTE — ED Triage Notes (Signed)
Pt arrived via [place] from [facility]. [facility] states that she began to have epigastric pain and received 3 nitro prior to calling EMS.

## 2018-12-31 NOTE — ED Notes (Signed)
Pt co epigastric pain, EDP made aware.

## 2018-12-31 NOTE — Consult Note (Signed)
[place]  [phone] [phone]  ID: Donna Montes OB: 1934/05/08  MR#: [id number]  WEX#:[id number]  Patient Care Team: Houston Siren., MD as PCP - General (Family Medicine)  CHIEF COMPLAINT: Thrombocytopenia.  INTERVAL HISTORY: Patient is an 83 year old female who was recently admitted to the hospital with worsening abdominal pain who was noted to have a significant thrombocytopenia on routine blood work.  Patient reports this was noted at her nursing home and has an appointment with a hematologist in [facility].  She continues to have a vague abdominal pain, but otherwise feels well.  She had a recent history of increased bruising, but none currently.  She has no neurologic complaints.  She denies any recent fevers or illnesses.  She has noted no new medications.  She denies any chest pain, shortness of breath, cough, or hemoptysis.  She has no nausea, vomiting, constipation, or diarrhea.  She has no urinary complaints.  Patient offers no further specific complaints today.  REVIEW OF SYSTEMS:   Review of Systems  Constitutional: Negative.  Negative for fever, malaise/fatigue and weight loss.  Respiratory: Negative.  Negative for cough, hemoptysis and shortness of breath.   Cardiovascular: Negative.  Negative for chest pain and leg swelling.  Gastrointestinal: Positive for abdominal pain. Negative for blood in stool, constipation, diarrhea, melena, nausea and vomiting.  Genitourinary: Negative.  Negative for hematuria.  Musculoskeletal: Positive for falls. Negative for back pain.  Skin: Negative.  Negative for rash.  Neurological: Negative.  Negative for dizziness, focal weakness, weakness and headaches.  Endo/Heme/Allergies: Bruises/bleeds easily.  Psychiatric/Behavioral: The patient is nervous/anxious.     As per HPI. Otherwise, a complete review of systems is negative.  PAST MEDICAL HISTORY: Past Medical History:  Diagnosis Date    Hyperlipidemia    Hypertension    Thyroid disease     PAST SURGICAL HISTORY: Past Surgical History:  Procedure Laterality Date   ABDOMINAL HYSTERECTOMY     CORONARY ANGIOPLASTY WITH STENT PLACEMENT     HERNIA REPAIR      FAMILY HISTORY: Family History  Problem Relation Age of Onset   Pancreatitis Brother        alcohol abuse    ADVANCED DIRECTIVES (Y/N):  @ADVDIR @  HEALTH MAINTENANCE: Social History   Tobacco Use   Smoking status: Never Smoker   Smokeless tobacco: Never Used  Substance Use Topics   Alcohol use: No   Drug use: Not on file     Colonoscopy:  PAP:  Bone density:  Lipid panel:  Allergies  Allergen Reactions   Nortriptyline Other (See Comments)    confusion    Sertraline Other (See Comments)    confusion    Duloxetine Other (See Comments)    Cant remember unknown    Gabapentin Other (See Comments)    Muscle weakess- pt states she fell   Codeine Rash    Current Facility-Administered Medications  Medication Dose Route Frequency Provider Last Rate Last Dose   0.9 %  sodium chloride infusion   Intravenous Continuous Harrie Foreman, MD 100 mL/hr at 12/31/18 1010     acetaminophen (TYLENOL) tablet 650 mg  650 mg Oral Q6H PRN Harrie Foreman, MD   650 mg at 12/31/18 1253   Or   acetaminophen (TYLENOL) suppository 650 mg  650 mg Rectal Q6H PRN Harrie Foreman, MD       calcium-vitamin D (OSCAL WITH D) 500-200 MG-UNIT per tablet   Oral Daily Henreitta Leber, MD  1 tablet at 12/31/18 1255   cholecalciferol (VITAMIN D3) tablet 2,000 Units  2,000 Units Oral Daily Houston SirenSainani, Vivek J, MD   2,000 Units at 12/31/18 1358   diclofenac sodium (VOLTAREN) 1 % transdermal gel 4 g  4 g Topical QID Houston SirenSainani, Vivek J, MD   4 g at 12/31/18 1359   docusate sodium (COLACE) capsule 100 mg  100 mg Oral BID Arnaldo Nataliamond, Michael S, MD   100 mg at 12/31/18 1007   ferrous sulfate tablet 325 mg  325 mg Oral Daily Houston SirenSainani, Vivek J, MD   325 mg at  12/31/18 1255   labetalol (NORMODYNE) tablet 200 mg  200 mg Oral TID Houston SirenSainani, Vivek J, MD       [START ON 01/01/2019] levothyroxine (SYNTHROID) tablet 75 mcg  75 mcg Oral Q0600 Houston SirenSainani, Vivek J, MD       lisinopril (ZESTRIL) tablet 5 mg  5 mg Oral Daily Houston SirenSainani, Vivek J, MD   5 mg at 12/31/18 1254   methocarbamol (ROBAXIN) tablet 500 mg  500 mg Oral QID Houston SirenSainani, Vivek J, MD   500 mg at 12/31/18 1300   ondansetron (ZOFRAN) tablet 4 mg  4 mg Oral Q6H PRN Arnaldo Nataliamond, Michael S, MD       Or   ondansetron [facility](ZOFRAN) injection 4 mg  4 mg Intravenous Q6H PRN Arnaldo Nataliamond, Michael S, MD       pantoprazole (PROTONIX) EC tablet 40 mg  40 mg Oral QAC breakfast Houston SirenSainani, Vivek J, MD   40 mg at 12/31/18 1255   pravastatin (PRAVACHOL) tablet 40 mg  40 mg Oral QHS Houston SirenSainani, Vivek J, MD       [START ON 01/01/2019] torsemide (DEMADEX) tablet 20 mg  20 mg Oral QODAY Sainani, Rolly PancakeVivek J, MD       traMADol (ULTRAM) tablet 50 mg  50 mg Oral Q6H PRN Houston SirenSainani, Vivek J, MD   50 mg at 12/31/18 1421    OBJECTIVE: Vitals:   12/31/18 0830 12/31/18 0900  BP: (!) 168/66 (!) 184/75  Pulse: 64 64  Resp: 15 19  Temp:  98.1 F (36.7 C)  SpO2: 95% 96%     Body mass index is 29.66 kg/m.    ECOG FS:1 - Symptomatic but completely ambulatory  General: Well-developed, well-nourished, no acute distress. Eyes: Pink conjunctiva, anicteric sclera. HEENT: Normocephalic, moist mucous membranes, clear oropharnyx. Lungs: Clear to auscultation bilaterally. Heart: Regular rate and rhythm. No rubs, murmurs, or gallops. Abdomen: Soft, nontender, nondistended. No organomegaly noted, normoactive bowel sounds. Musculoskeletal: No edema, cyanosis, or clubbing. Neuro: Alert, answering all questions appropriately. Cranial nerves grossly intact. Skin: No rashes or petechiae noted. Psych: Normal affect. Lymphatics: No cervical, calvicular, axillary or inguinal LAD.   LAB RESULTS:  Lab Results  Component Value Date   NA 141 12/31/2018   K  3.5 12/31/2018   CL 108 12/31/2018   CO2 25 12/31/2018   GLUCOSE 111 (H) 12/31/2018   BUN 22 12/31/2018   CREATININE 1.09 (H) 12/31/2018   CALCIUM 8.6 (L) 12/31/2018   PROT 6.6 12/31/2018   ALBUMIN 3.7 12/31/2018   AST 15 12/31/2018   ALT 10 12/31/2018   ALKPHOS 85 12/31/2018   BILITOT 0.6 12/31/2018   GFRNONAA 47 (L) 12/31/2018   GFRAA 55 (L) 12/31/2018    Lab Results  Component Value Date   WBC 8.7 12/31/2018   NEUTROABS 6.2 12/31/2018   HGB 10.6 (L) 12/31/2018   HCT 33.4 (L) 12/31/2018   MCV 92.5 12/31/2018   PLT 36 (  L) 12/31/2018     STUDIES: Ct Abdomen Pelvis W Contrast  Result Date: 12/31/2018 CLINICAL DATA:  Epigastric abdominal pain. EXAM: CT ABDOMEN AND PELVIS WITH CONTRAST TECHNIQUE: Multidetector CT imaging of the abdomen and pelvis was performed using the standard protocol following bolus administration of intravenous contrast. CONTRAST:  OMNIPAQUE IOHEXOL 300 MG/ML  SOLN COMPARISON:  Radiographs earlier this day. Right upper quadrant ultrasound 12/14/2018. CT 05/01/2016 FINDINGS: Lower chest: Minor lung base atelectasis. Decreased size of hiatal hernia from prior exam, no small in size. Upper normal heart size. Hepatobiliary: No focal liver abnormality is seen. No gallstones, gallbladder wall thickening, or biliary dilatation. Pancreas: No ductal dilatation or inflammation. Spleen: Normal in size without focal abnormality. Adrenals/Urinary Tract: Normal adrenal glands. No hydronephrosis or perinephric edema. Homogeneous renal enhancement with symmetric excretion on delayed phase imaging. Mild bilateral renal parenchymal thinning. Urinary bladder is physiologically distended without wall thickening. Tiny focus of air in the urinary bladder may be iatrogenic. Stomach/Bowel: Reduction in size of previous large hiatal hernia, postsurgical change the gastroesophageal junction. Small residual hiatal hernia. Majority of the stomach is now intra-abdominal. No small bowel  wall thickening, obstruction, or inflammation. Normal appendix. Moderate colonic stool burden. Colonic diverticulosis, prominent in the sigmoid colon. No diverticulitis. Vascular/Lymphatic: Aorto bi-iliac atherosclerosis. No aneurysm. Prominent 7 mm right external iliac node is nonspecific. No enlarged lymph nodes in the abdomen or pelvis. Reproductive: Uterus is surgically absent. Ovaries tentatively identified and quiescent. No adnexal mass. Other: No free air, free fluid, or intra-abdominal fluid collection. Musculoskeletal: Degenerative anterolisthesis of L4 on L5 has slightly increased from 2018 CT. Degenerative change in the right hip. Unchanged T9 vertebral body hemangioma. No acute osseous abnormalities. IMPRESSION: 1. No acute abnormality or explanation for abdominal pain. 2. Colonic diverticulosis without diverticulitis. Moderate colonic stool burden. 3. Prior hiatal hernia repair, small residual or recurrent hiatal hernia. Aortic Atherosclerosis (ICD10-I70.0). Electronically Signed   By: Narda Rutherford M.D.   On: 12/31/2018 03:39   Dg Abdomen Acute W/chest  Result Date: 12/31/2018 CLINICAL DATA:  Epigastric pain. EXAM: DG ABDOMEN ACUTE W/ 1V CHEST COMPARISON:  Report from chest radiograph 05/10/2009, images not available. FINDINGS: Small metallic densities at the left lung base with adjacent linear densities, presumably scarring and chronic. No acute airspace disease. Heart is normal in size with normal mediastinal contours. No pleural effusion or pulmonary edema. No free intra-abdominal air. No bowel dilatation to suggest obstruction. Small volume of colonic stool. Multiple pelvic calcifications are most consistent with phleboliths. No evidence of radiopaque calculi. Mild scoliosis. Osteoarthritis of the right hip. IMPRESSION: 1. Left lung base scarring. 2. Normal bowel gas pattern. No free air. Electronically Signed   By: Narda Rutherford M.D.   On: 12/31/2018 02:25    ASSESSMENT:  Thrombocytopenia.  PLAN:    1.  Thrombocytopenia: Patient reports this is not a new diagnosis and plan was to have a work-up as an outpatient in [facility].  We do not have these laboratory results in hand.  Her most recent platelet count in Epic was in 2018 which was normal.  Will initiate work-up today with additional laboratory work.  If patient is discharged, please ensure she keeps her previously scheduled follow-up appointment in [facility].  If patient does not have a follow-up with a hematologist, please ensure she has follow-up at [facility] 1 to 2 weeks after discharge.  2.  Abdominal pain: Unclear etiology.  CT scan did not reveal any acute abnormality or pathology. 3.  Falls: Patient  has a fractured right forearm secondary to recent fall.  Continue follow-up with orthopedics as recommended.  Jeralyn Ruths, MD   12/31/2018 3:21 PM

## 2018-12-31 NOTE — ED Notes (Signed)
Ct done, blood to lab.

## 2018-12-31 NOTE — ED Notes (Signed)
Pt awaiting admission

## 2018-12-31 NOTE — Progress Notes (Signed)
   12/31/18 1400  Clinical Encounter Type  Visited With Patient;Health care provider  Visit Type Initial  Referral From Nurse  Ch received an OR for AD creation. Pt told the ch that she has her daughter as her HCPOA and that the daughter has the document. Pt is not from around this area. Ch asked the pt to consider brining the document here so that [facility] can have the information on the chart.

## 2018-12-31 NOTE — ED Notes (Signed)
Pt co 10/10 epigastric pain, pt disposition changed and will not be discharged at this time.

## 2018-12-31 NOTE — H&P (Signed)
Donna Montes is an 84 y.o. female.   Chief Complaint: Abdominal pain HPI: The patient with past medical history of hypertension and thyroid disease presents to the emergency department due to severe abdominal pain.  The patient points to her epigastric region as the source of pain.  She denies nausea or vomiting.  She has not eaten any spicy foods or had trouble swallowing.  The pain awoke her from sleep tonight.  She denies chest pain or radiation of the pain.  CT scan showed no acute intra-abdominal pathology.  Following analgesia which barely relieved the patient's pain the emergency department staff call hospitalist service for admission.  Past Medical History:  Diagnosis Date  . Hyperlipidemia   . Hypertension   . Thyroid disease     Past Surgical History:  Procedure Laterality Date  . ABDOMINAL HYSTERECTOMY    . CORONARY ANGIOPLASTY WITH STENT PLACEMENT    . HERNIA REPAIR      Family History  Problem Relation Age of Onset  . Pancreatitis Brother        alcohol abuse   Social History:  reports that she has never smoked. She has never used smokeless tobacco. She reports that she does not drink alcohol. No history on file for drug.  Allergies:  Allergies  Allergen Reactions  . Codeine Rash    (Not in a hospital admission)   Results for orders placed or performed during the hospital encounter of 12/31/18 (from the past 48 hour(s))  CBC with Differential/Platelet     Status: Abnormal   Collection Time: 12/31/18  1:41 AM  Result Value Ref Range   WBC 8.7 4.0 - 10.5 K/uL   RBC 3.61 (L) 3.87 - 5.11 MIL/uL   Hemoglobin 10.6 (L) 12.0 - 15.0 g/dL   HCT 16.133.4 (L) 09.636.0 - 04.546.0 %   MCV 92.5 80.0 - 100.0 fL   MCH 29.4 26.0 - 34.0 pg   MCHC 31.7 30.0 - 36.0 g/dL   RDW 40.915.4 81.111.5 - 91.415.5 %   Platelets 36 (L) 150 - 400 K/uL    Comment: Immature Platelet Fraction may be clinically indicated, consider ordering this additional test [id number]    nRBC 0.0 0.0 - 0.2 %   Neutrophils  Relative % 71 %   Neutro Abs 6.2 1.7 - 7.7 K/uL   Lymphocytes Relative 18 %   Lymphs Abs 1.6 0.7 - 4.0 K/uL   Monocytes Relative 8 %   Monocytes Absolute 0.7 0.1 - 1.0 K/uL   Eosinophils Relative 1 %   Eosinophils Absolute 0.1 0.0 - 0.5 K/uL   Basophils Relative 1 %   Basophils Absolute 0.1 0.0 - 0.1 K/uL   Immature Granulocytes 1 %   Abs Immature Granulocytes 0.04 0.00 - 0.07 K/uL    Comment: Performed at [facility], [address] [place], [place] [address]  Comprehensive metabolic panel     Status: Abnormal   Collection Time: 12/31/18  1:41 AM  Result Value Ref Range   Sodium 141 135 - 145 mmol/L   Potassium 3.5 3.5 - 5.1 mmol/L   Chloride 108 98 - 111 mmol/L   CO2 25 22 - 32 mmol/L   Glucose, Bld 111 (H) 70 - 99 mg/dL   BUN 22 8 - 23 mg/dL   Creatinine, Ser 7.841.09 (H) 0.44 - 1.00 mg/dL   Calcium 8.6 (L) 8.9 - 10.3 mg/dL   Total Protein 6.6 6.5 - 8.1 g/dL   Albumin 3.7 3.5 - 5.0 g/dL   AST  15 15 - 41 U/L   ALT 10 0 - 44 U/L   Alkaline Phosphatase 85 38 - 126 U/L   Total Bilirubin 0.6 0.3 - 1.2 mg/dL   GFR calc non Af Amer 47 (L) >60 mL/min   GFR calc Af Amer 55 (L) >60 mL/min   Anion gap 8 5 - 15    Comment: Performed at [facility], [address] [place], [place] [address]  Lipase, blood     Status: Abnormal   Collection Time: 12/31/18  1:41 AM  Result Value Ref Range   Lipase 60 (H) 11 - 51 U/L    Comment: Performed at [facility], [address] [place], [place] [address]  Troponin I (High Sensitivity)     Status: None   Collection Time: 12/31/18  1:41 AM  Result Value Ref Range   Troponin I (High Sensitivity) 6 <18 ng/L    Comment: (NOTE) Elevated high sensitivity troponin I (hsTnI) values and significant  changes across serial measurements may suggest ACS but many other  chronic and acute conditions are known to elevate hsTnI results.  Refer to the "Links" section for chest pain algorithms and additional  guidance. Performed  at [facility], [address] [place], [place] [address]   Troponin I (High Sensitivity)     Status: None   Collection Time: 12/31/18  3:48 AM  Result Value Ref Range   Troponin I (High Sensitivity) 6 <18 ng/L    Comment: (NOTE) Elevated high sensitivity troponin I (hsTnI) values and significant  changes across serial measurements may suggest ACS but many other  chronic and acute conditions are known to elevate hsTnI results.  Refer to the "Links" section for chest pain algorithms and additional  guidance. Performed at [facility], [address], [place], [place] [address]    Ct Abdomen Pelvis W Contrast  Result Date: 12/31/2018 CLINICAL DATA:  Epigastric abdominal pain. EXAM: CT ABDOMEN AND PELVIS WITH CONTRAST TECHNIQUE: Multidetector CT imaging of the abdomen and pelvis was performed using the standard protocol following bolus administration of intravenous contrast. CONTRAST:  OMNIPAQUE IOHEXOL 300 MG/ML  SOLN COMPARISON:  Radiographs earlier this day. Right upper quadrant ultrasound 12/14/2018. CT 05/01/2016 FINDINGS: Lower chest: Minor lung base atelectasis. Decreased size of hiatal hernia from prior exam, no small in size. Upper normal heart size. Hepatobiliary: No focal liver abnormality is seen. No gallstones, gallbladder wall thickening, or biliary dilatation. Pancreas: No ductal dilatation or inflammation. Spleen: Normal in size without focal abnormality. Adrenals/Urinary Tract: Normal adrenal glands. No hydronephrosis or perinephric edema. Homogeneous renal enhancement with symmetric excretion on delayed phase imaging. Mild bilateral renal parenchymal thinning. Urinary bladder is physiologically distended without wall thickening. Tiny focus of air in the urinary bladder may be iatrogenic. Stomach/Bowel: Reduction in size of previous large hiatal hernia, postsurgical change the gastroesophageal junction. Small residual hiatal hernia. Majority of the  stomach is now intra-abdominal. No small bowel wall thickening, obstruction, or inflammation. Normal appendix. Moderate colonic stool burden. Colonic diverticulosis, prominent in the sigmoid colon. No diverticulitis. Vascular/Lymphatic: Aorto bi-iliac atherosclerosis. No aneurysm. Prominent 7 mm right external iliac node is nonspecific. No enlarged lymph nodes in the abdomen or pelvis. Reproductive: Uterus is surgically absent. Ovaries tentatively identified and quiescent. No adnexal mass. Other: No free air, free fluid, or intra-abdominal fluid collection. Musculoskeletal: Degenerative anterolisthesis of L4 on L5 has slightly increased from 2018 CT. Degenerative change in the right hip. Unchanged T9 vertebral body hemangioma. No acute  osseous abnormalities. IMPRESSION: 1. No acute abnormality or explanation for abdominal pain. 2. Colonic diverticulosis without diverticulitis. Moderate colonic stool burden. 3. Prior hiatal hernia repair, small residual or recurrent hiatal hernia. Aortic Atherosclerosis (ICD10-I70.0). Electronically Signed   By: Narda Rutherford M.D.   On: 12/31/2018 03:39   Dg Abdomen Acute W/chest  Result Date: 12/31/2018 CLINICAL DATA:  Epigastric pain. EXAM: DG ABDOMEN ACUTE W/ 1V CHEST COMPARISON:  Report from chest radiograph 05/10/2009, images not available. FINDINGS: Small metallic densities at the left lung base with adjacent linear densities, presumably scarring and chronic. No acute airspace disease. Heart is normal in size with normal mediastinal contours. No pleural effusion or pulmonary edema. No free intra-abdominal air. No bowel dilatation to suggest obstruction. Small volume of colonic stool. Multiple pelvic calcifications are most consistent with phleboliths. No evidence of radiopaque calculi. Mild scoliosis. Osteoarthritis of the right hip. IMPRESSION: 1. Left lung base scarring. 2. Normal bowel gas pattern. No free air. Electronically Signed   By: Narda Rutherford M.D.   On:  12/31/2018 02:25    Review of Systems  Constitutional: Negative for chills and fever.  HENT: Negative for sore throat and tinnitus.   Eyes: Negative for blurred vision and redness.  Respiratory: Negative for cough and shortness of breath.   Cardiovascular: Positive for leg swelling (Chronic). Negative for chest pain, palpitations, orthopnea and PND.  Gastrointestinal: Positive for abdominal pain and melena (The patient also takes iron). Negative for diarrhea, nausea and vomiting.  Genitourinary: Negative for dysuria, frequency and urgency.  Musculoskeletal: Positive for falls (3 weeks ago; recurrent). Negative for joint pain and myalgias.  Skin: Negative for rash.       No lesions  Neurological: Negative for speech change, focal weakness and weakness.  Endo/Heme/Allergies: Does not bruise/bleed easily.       No temperature intolerance  Psychiatric/Behavioral: Negative for depression and suicidal ideas.    Blood pressure (!) 191/87, pulse 65, temperature 98.2 F (36.8 C), temperature source Oral, resp. rate (!) 21, height  (1.549 m), weight 71.2 kg, SpO2 98 %. Physical Exam  Vitals reviewed. Constitutional: She is oriented to person, place, and time. She appears well-developed and well-nourished. No distress.  HENT:  Head: Normocephalic and atraumatic.  Mouth/Throat: Oropharynx is clear and moist.  Eyes: Pupils are equal, round, and reactive to light. Conjunctivae and EOM are normal. No scleral icterus.  Neck: Normal range of motion. Neck supple. No JVD present. No tracheal deviation present. No thyromegaly present.  Cardiovascular: Normal rate, regular rhythm and normal heart sounds. Exam reveals no gallop and no friction rub.  No murmur heard. Respiratory: Effort normal and breath sounds normal.  GI: Soft. Bowel sounds are normal. She exhibits no distension. There is no abdominal tenderness.  Genitourinary:    Genitourinary Comments: Deferred   Musculoskeletal: Normal range  of motion.        General: No edema.  Lymphadenopathy:    She has no cervical adenopathy.  Neurological: She is alert and oriented to person, place, and time. No cranial nerve deficit. She exhibits normal muscle tone.  Skin: Skin is warm and dry. No rash noted. No erythema.  Psychiatric: She has a normal mood and affect. Her behavior is normal. Judgment and thought content normal.     Assessment/Plan This is an 84 year old female admitted for thrombocytopenia. 1.  Thrombocytopenia: The patient reports easy bleeding.  Platelets found to be 36,000; no bleeding diatheses at this time.  No elevation in LFTs nor any nausea  or vomiting at this time.  Awaiting MAR from nursing home.  Rule out medication etiology.  Unclear if abdominal pain is associated with thrombocytopenia.  Consult hematology. 2.  Intractable abdominal pain: Known hiatal hernia.  Mild elevation in lipase that does not meet criteria for pancreatitis.  (CT scan normal except for hernia).  Manage severe pain with IV morphine as needed.  The patient is n.p.o. consult gastroenterology. 3.  Anemia: Mild; monitor blood count. 4.  Hypertension: Uncontrolled; labetalol as needed.  Continue antihypertensive medication per home list 5.  Thyroid disease: Check TSH; resume medication once MAR obtained 6.  DVT prophylaxis: SCDs 7.  GI prophylaxis: Avoid H2 blockers. The patient is a full code.  Time spent on admission orders and patient care approximately 45 minutes  Harrie Foreman, MD 12/31/2018, 7:55 AM

## 2018-12-31 NOTE — ED Notes (Signed)
Attempted to Call [facility] several times to give report without success.

## 2018-12-31 NOTE — ED Notes (Signed)
Report received from end-shift RN. Patient care assumed. Patient/RN introduction complete. Will continue to monitor. Pt resting comfortably with eyes open, denies any pain at this time. Pt awaiting CT scan.

## 2018-12-31 NOTE — ED Notes (Addendum)
Pt states that approximately 2215 last night she began to experience sharp pain in the middle of her chest. States that the pain is intermittent; however, when the pain occurs it is at a 9. States that she has felt the same pain previously when she had a hernia. Upon palpation the middle of her chest is tender to touch.

## 2019-01-01 ENCOUNTER — Other Ambulatory Visit: Payer: Self-pay | Admitting: *Deleted

## 2019-01-01 DIAGNOSIS — K219 Gastro-esophageal reflux disease without esophagitis: Secondary | ICD-10-CM

## 2019-01-01 DIAGNOSIS — R109 Unspecified abdominal pain: Secondary | ICD-10-CM

## 2019-01-01 DIAGNOSIS — Z79899 Other long term (current) drug therapy: Secondary | ICD-10-CM

## 2019-01-01 DIAGNOSIS — D696 Thrombocytopenia, unspecified: Secondary | ICD-10-CM

## 2019-01-01 LAB — CBC
HCT: 33.7 % — ABNORMAL LOW (ref 36.0–46.0)
Hemoglobin: 10.6 g/dL — ABNORMAL LOW (ref 12.0–15.0)
MCH: 29.4 pg (ref 26.0–34.0)
MCHC: 31.5 g/dL (ref 30.0–36.0)
MCV: 93.6 fL (ref 80.0–100.0)
Platelets: 25 10*3/uL — CL (ref 150–400)
RBC: 3.6 MIL/uL — ABNORMAL LOW (ref 3.87–5.11)
RDW: 15.3 % (ref 11.5–15.5)
WBC: 4.3 10*3/uL (ref 4.0–10.5)
nRBC: 0 % (ref 0.0–0.2)

## 2019-01-01 LAB — HEMOGLOBIN A1C
Hgb A1c MFr Bld: 5.5 % (ref 4.8–5.6)
Mean Plasma Glucose: 111 mg/dL

## 2019-01-01 MED ORDER — SUCRALFATE 1 GM/10ML PO SUSP
1.0000 g | Freq: Three times a day (TID) | ORAL | Status: DC
  Administered 2019-01-01 – 2019-01-03 (×7): 1 g via ORAL
  Filled 2019-01-01 (×7): qty 10

## 2019-01-01 MED ORDER — PREDNISONE 50 MG PO TABS
50.0000 mg | ORAL_TABLET | Freq: Every day | ORAL | Status: DC
  Administered 2019-01-01 – 2019-01-03 (×2): 50 mg via ORAL
  Filled 2019-01-01 (×3): qty 1

## 2019-01-01 NOTE — Evaluation (Signed)
Physical Therapy Evaluation Patient Details Name: Donna Montes MRN: [id number] DOB: [birth year] Today's Date: 01/01/2019   History of Present Illness  Pt 84 y/o F admitted to address thrombocytopenia; pt also has c/o epigastric pain. PMH includes HTN, HLD, CAD, CHF, GERD  Clinical Impression  Pt is a pleasant 84 year old F who was admitted for thrombocytopenia. Upon entry, pt is resting in bed with her daughter in the room; states pain 4/10 in epigastric region. Pt just had rehab in SNF to address her R UE and general unsteadiness on her feet. This date she performs bed mobility, transfers, and ambulation with min guard and use of IV pole. Pt demonstrates deficits with strength and mobility. Pt will benefit from skilled PT to address above deficits; current follow-up care recommendation is HHPT.     Follow Up Recommendations Home health PT    Equipment Recommendations       Recommendations for Other Services       Precautions / Restrictions Precautions Precautions: None Restrictions Weight Bearing Restrictions: Yes Other Position/Activity Restrictions: Pt reports NWB on R UE      Mobility  Bed Mobility Overal bed mobility: Needs Assistance Bed Mobility: Sit to Supine       Sit to supine: Min guard   General bed mobility comments: Pt able to perform bed mobility using bed rails; has minor difficulty due to NWB on R UE. Pt has lateral lean toward R side at EOB  Transfers Overall transfer level: Needs assistance Equipment used: (IV pole) Transfers: Sit to/from Stand Sit to Stand: Min guard         General transfer comment: CGA applied for safety due to use of IV pole as AD. Pt able to perform without LOB  Ambulation/Gait Ambulation/Gait assistance: Min guard Gait Distance (Feet): 200 Feet Assistive device: IV Pole Gait Pattern/deviations: Step-through pattern;Decreased stride length;Narrow base of support;Antalgic     General Gait Details: Pt had pain in the back of  R knee toward the end of amb and developed somewhat antalgic gait pattern. Otherwise pt amb with reduced stride length and narrow BOS.   Stairs            Wheelchair Mobility    Modified Rankin (Stroke Patients Only)       Balance Overall balance assessment: History of Falls;Needs assistance Sitting-balance support: Single extremity supported;Feet supported Sitting balance-Leahy Scale: Fair Sitting balance - Comments: Trunk lean toward R side, able to hold seated position despite lean   Standing balance support: Single extremity supported Standing balance-Leahy Scale: Good Standing balance comment: Pt able to stand and amb with use of IV pole during session.                              Pertinent Vitals/Pain Pain Assessment: 0-10 Pain Score: 4  Pain Location: epigastric region Pain Descriptors / Indicators: Aching Pain Intervention(s): Monitored during session;Limited activity within patient's tolerance    Home Living Family/patient expects to be discharged to:: Private residence Living Arrangements: Alone Available Help at Discharge: Family;Available PRN/intermittently Type of Home: House Home Access: Stairs to enter Entrance Stairs-Rails: Right Entrance Stairs-Number of Steps: 3 Home Layout: One level Home Equipment: Wheelchair - manual;Cane - single point Additional Comments: Pt just had treatment at SNF; reports her d/c was supposed to be today/tomorrow so she expects to return home. Pt amb mostly using cane and wheelchair as needed    Prior Function Level of Independence: Independent  with assistive device(s)         Comments: Someone comes to the house to clean; otherwise independent in ADL's     Hand Dominance        Extremity/Trunk Assessment   Upper Extremity Assessment Upper Extremity Assessment: Generalized weakness    Lower Extremity Assessment Lower Extremity Assessment: Generalized weakness    Cervical / Trunk  Assessment Cervical / Trunk Assessment: Kyphotic  Communication   Communication: No difficulties  Cognition Arousal/Alertness: Awake/alert Behavior During Therapy: WFL for tasks assessed/performed Overall Cognitive Status: Within Functional Limits for tasks assessed                                        General Comments      Exercises Other Exercises Other Exercises: Supine, 10 reps, bilateral: AP, HS, SLR; PT supervision Other Exercises: Seated, 10 reps, bilateral: march; PT supervision   Assessment/Plan    PT Assessment Patient needs continued PT services  PT Problem List Decreased strength;Decreased activity tolerance;Decreased balance;Decreased mobility;Pain       PT Treatment Interventions DME instruction;Stair training;Gait training;Functional mobility training;Therapeutic activities;Therapeutic exercise;Balance training;Patient/family education    PT Goals (Current goals can be found in the Care Plan section)  Acute Rehab PT Goals Patient Stated Goal: to return home PT Goal Formulation: With patient Time For Goal Achievement: 01/15/19 Potential to Achieve Goals: Good    Frequency Min 2X/week   Barriers to discharge        Co-evaluation               AM-PAC PT "6 Clicks" Mobility  Outcome Measure Help needed turning from your back to your side while in a flat bed without using bedrails?: A Little Help needed moving from lying on your back to sitting on the side of a flat bed without using bedrails?: A Little Help needed moving to and from a bed to a chair (including a wheelchair)?: A Little Help needed standing up from a chair using your arms (e.g., wheelchair or bedside chair)?: A Little Help needed to walk in hospital room?: A Little Help needed climbing 3-5 steps with a railing? : A Little 6 Click Score: 18    End of Session Equipment Utilized During Treatment: Gait belt Activity Tolerance: Patient tolerated treatment well Patient  left: in chair;with call bell/phone within reach;with chair alarm set;with family/visitor present   PT Visit Diagnosis: Muscle weakness (generalized) (M62.81);History of falling (Z91.81);Pain Pain - Right/Left: (epigastric pain)    Time: 4098-1191 PT Time Calculation (min) (ACUTE ONLY): 34 min   Charges:              Gaspar Garbe, SPT   Shawntay Prest 01/01/2019, 3:11 PM

## 2019-01-01 NOTE — Progress Notes (Signed)
CRITICAL VALUE ALERT  Critical Value: Platelets 25  Date & Time Notied:  01/01/2019 0430  Provider Notified: Marcille Blanco  Orders Received/Actions taken: admitting dx of thrombocytopenia, no new orders at this time.

## 2019-01-01 NOTE — Progress Notes (Signed)
Attempted to reschedule Hematology and Oncology appointment in [facility] to closer date. Office to call back. Dr. Grayland Ormond made aware. Madlyn Frankel, RN

## 2019-01-01 NOTE — Progress Notes (Signed)
[employer] - [place] at [facility]     PATIENT NAME: Donna Montes    MR#:  [id number]  DATE OF BIRTH:  February 24, 1935  SUBJECTIVE:   Still complaining of some vague abdominal pain and burning when she eats.  Plt. Count down to 25 but no acute bleeding.   Patient's daughter is at bedside.  REVIEW OF SYSTEMS:    Review of Systems  Constitutional: Negative for chills and fever.  HENT: Negative for congestion and tinnitus.   Eyes: Negative for blurred vision and double vision.  Respiratory: Negative for cough, shortness of breath and wheezing.   Cardiovascular: Negative for chest pain, orthopnea and PND.  Gastrointestinal: Positive for abdominal pain. Negative for diarrhea, nausea and vomiting.  Genitourinary: Negative for dysuria and hematuria.  Neurological: Negative for dizziness, sensory change and focal weakness.  All other systems reviewed and are negative.   Nutrition: Heart healthy Tolerating Diet: Yes Tolerating PT: Await Eval.      DRUG ALLERGIES:   Allergies  Allergen Reactions  . Nortriptyline Other (See Comments)    confusion   . Sertraline Other (See Comments)    confusion   . Duloxetine Other (See Comments)    Cant remember unknown   . Gabapentin Other (See Comments)    Muscle weakess- pt states she fell  . Codeine Rash    VITALS:  Blood pressure (!) 179/76, pulse 69, temperature 98 F (36.7 C), resp. rate 18, height 5\' 1"  (1.549 m), weight 71.2 kg, SpO2 97 %.  PHYSICAL EXAMINATION:   Physical Exam  GENERAL:  84 y.o.-year-old patient lying in bed in no acute distress.  EYES: Pupils equal, round, reactive to light and accommodation. No scleral icterus. Extraocular muscles intact.  HEENT: Head atraumatic, normocephalic. Oropharynx and nasopharynx clear.  NECK:  Supple, no jugular venous distention. No thyroid enlargement, no tenderness.  LUNGS: Normal breath sounds bilaterally, no wheezing, rales, rhonchi. No use of accessory  muscles of respiration.  CARDIOVASCULAR: S1, S2 normal. No murmurs, rubs, or gallops.  ABDOMEN: Soft, Tender in epigastric area but no rebound, rigidity, nondistended. Bowel sounds present. No organomegaly or mass.  EXTREMITIES: No cyanosis, clubbing or edema b/l.   Right upper extremity in a cast. NEUROLOGIC: Cranial nerves II through XII are intact. No focal Motor or sensory deficits b/l.  Globally weak.  PSYCHIATRIC: The patient is alert and oriented x 3.  SKIN: No obvious rash, lesion, or ulcer.    LABORATORY PANEL:   CBC Recent Labs  Lab 01/01/19 0350  WBC 4.3  HGB 10.6*  HCT 33.7*  PLT 25*   ------------------------------------------------------------------------------------------------------------------  Chemistries  Recent Labs  Lab 12/31/18 0141  NA 141  K 3.5  CL 108  CO2 25  GLUCOSE 111*  BUN 22  CREATININE 1.09*  CALCIUM 8.6*  AST 15  ALT 10  ALKPHOS 85  BILITOT 0.6   ------------------------------------------------------------------------------------------------------------------  Cardiac Enzymes No results for input(s): TROPONINI in the last 168 hours. ------------------------------------------------------------------------------------------------------------------  RADIOLOGY:  Ct Abdomen Pelvis W Contrast  Result Date: 12/31/2018 CLINICAL DATA:  Epigastric abdominal pain. EXAM: CT ABDOMEN AND PELVIS WITH CONTRAST TECHNIQUE: Multidetector CT imaging of the abdomen and pelvis was performed using the standard protocol following bolus administration of intravenous contrast. CONTRAST:  01/02/2019 OMNIPAQUE IOHEXOL 300 MG/ML  SOLN COMPARISON:  Radiographs earlier this day. Right upper quadrant ultrasound 12/14/2018. CT 05/01/2016 FINDINGS: Lower chest: Minor lung base atelectasis. Decreased size of hiatal hernia from prior exam, no small in size. Upper normal heart size.  Hepatobiliary: No focal liver abnormality is seen. No gallstones, gallbladder wall thickening,  or biliary dilatation. Pancreas: No ductal dilatation or inflammation. Spleen: Normal in size without focal abnormality. Adrenals/Urinary Tract: Normal adrenal glands. No hydronephrosis or perinephric edema. Homogeneous renal enhancement with symmetric excretion on delayed phase imaging. Mild bilateral renal parenchymal thinning. Urinary bladder is physiologically distended without wall thickening. Tiny focus of air in the urinary bladder may be iatrogenic. Stomach/Bowel: Reduction in size of previous large hiatal hernia, postsurgical change the gastroesophageal junction. Small residual hiatal hernia. Majority of the stomach is now intra-abdominal. No small bowel wall thickening, obstruction, or inflammation. Normal appendix. Moderate colonic stool burden. Colonic diverticulosis, prominent in the sigmoid colon. No diverticulitis. Vascular/Lymphatic: Aorto bi-iliac atherosclerosis. No aneurysm. Prominent 7 mm right external iliac node is nonspecific. No enlarged lymph nodes in the abdomen or pelvis. Reproductive: Uterus is surgically absent. Ovaries tentatively identified and quiescent. No adnexal mass. Other: No free air, free fluid, or intra-abdominal fluid collection. Musculoskeletal: Degenerative anterolisthesis of L4 on L5 has slightly increased from 2018 CT. Degenerative change in the right hip. Unchanged T9 vertebral body hemangioma. No acute osseous abnormalities. IMPRESSION: 1. No acute abnormality or explanation for abdominal pain. 2. Colonic diverticulosis without diverticulitis. Moderate colonic stool burden. 3. Prior hiatal hernia repair, small residual or recurrent hiatal hernia. Aortic Atherosclerosis (ICD10-I70.0). Electronically Signed   By: Narda RutherfordMelanie  Sanford M.D.   On: 12/31/2018 03:39   Dg Abdomen Acute W/chest  Result Date: 12/31/2018 CLINICAL DATA:  Epigastric pain. EXAM: DG ABDOMEN ACUTE W/ 1V CHEST COMPARISON:  Report from chest radiograph 05/10/2009, images not available. FINDINGS: Small  metallic densities at the left lung base with adjacent linear densities, presumably scarring and chronic. No acute airspace disease. Heart is normal in size with normal mediastinal contours. No pleural effusion or pulmonary edema. No free intra-abdominal air. No bowel dilatation to suggest obstruction. Small volume of colonic stool. Multiple pelvic calcifications are most consistent with phleboliths. No evidence of radiopaque calculi. Mild scoliosis. Osteoarthritis of the right hip. IMPRESSION: 1. Left lung base scarring. 2. Normal bowel gas pattern. No free air. Electronically Signed   By: Narda RutherfordMelanie  Sanford M.D.   On: 12/31/2018 02:25     ASSESSMENT AND PLAN:   84 year old female with past medical history of hypertension, hypothyroidism, osteoporosis, hyperlipidemia, GERD who presented to the hospital due to abdominal pain and also noted to be thrombocytopenic.  1.  Abdominal pain-etiology unclear presently. ?? Gastritis or related to her Hiatal Hernia. Patient CT abdomen pelvis was negative for acute pathology. -LFTs/lipase level normal.  Patient is afebrile, white cell count is normal. -Tolerating clear liquids and will advance to heart healthy.  We will also continue PPI and add some Carafate. -If not improving consider getting gastroenterology consult.  2.  Thrombocytopenia-is down to 25 today.  Etiology unclear.  Appreciate hematology oncology input and they have initiated serologic work-up. -No acute bleeding.  Await further input from hematology oncology.  No acute need for transfusion presently.  3. Essential hypertension-continue lisinopril, labetalol.    4.  Hypothyroidism-continue Synthroid.  5. GERD - cont. Protonix.   6. Hyperlipidemia - cont. Pravachol.   7. Osteoporosis - cont. Calcium/vit-D supplements.   We will get physical therapy to ambulate patient today.  All the records are reviewed and case discussed with Care Management/Social Worker. Management plans discussed  with the patient, family and they are in agreement.  CODE STATUS: Full code  DVT Prophylaxis: Ted's & SCD's.   TOTAL TIME TAKING CARE  OF THIS PATIENT: 30 minutes.   POSSIBLE D/C IN 1-2 DAYS, DEPENDING ON CLINICAL CONDITION.   Henreitta Leber M.D on 01/01/2019 at 1:13 PM  Between 7am to 6pm - Pager - [phone]  After 6pm go to [email or web address] - password [email or web address] [facility]  Office  [phone]  CC: Primary care physician; Houston Siren., MD

## 2019-01-01 NOTE — Progress Notes (Signed)
[place]  Telephone:([phone] [phone]  ID: Donna Montes OB: [birth year]  MR#: [id number]  CVE#:[id number]  Patient Care Team: Houston Siren., MD as PCP - General (Family Medicine)  CHIEF COMPLAINT: Thrombocytopenia, abdominal pain.  INTERVAL HISTORY: Patient feels marginally improved today.  No new complaints.  No active bleeding despite declining platelet count.  REVIEW OF SYSTEMS:   Review of Systems  Constitutional: Negative.  Negative for fever, malaise/fatigue and weight loss.  Respiratory: Negative.  Negative for cough, hemoptysis and shortness of breath.   Cardiovascular: Negative.  Negative for chest pain.  Gastrointestinal: Positive for abdominal pain. Negative for blood in stool, constipation, diarrhea, melena, nausea and vomiting.  Genitourinary: Negative.  Negative for hematuria.  Musculoskeletal: Negative.  Negative for back pain.  Skin: Negative.  Negative for rash.  Neurological: Negative.  Negative for dizziness, seizures, weakness and headaches.  Endo/Heme/Allergies: Does not bruise/bleed easily.  Psychiatric/Behavioral: Negative.  The patient is not nervous/anxious.     As per HPI. Otherwise, a complete review of systems is negative.  PAST MEDICAL HISTORY: Past Medical History:  Diagnosis Date   Hyperlipidemia    Hypertension    Thyroid disease     PAST SURGICAL HISTORY: Past Surgical History:  Procedure Laterality Date   ABDOMINAL HYSTERECTOMY     CORONARY ANGIOPLASTY WITH STENT PLACEMENT     HERNIA REPAIR      FAMILY HISTORY: Family History  Problem Relation Age of Onset   Pancreatitis Brother        alcohol abuse    ADVANCED DIRECTIVES (Y/N):  @ADVDIR @  HEALTH MAINTENANCE: Social History   Tobacco Use   Smoking status: Never Smoker   Smokeless tobacco: Never Used  Substance Use Topics   Alcohol use: No   Drug use: Not on file     Colonoscopy:  PAP:  Bone density:  Lipid  panel:  Allergies  Allergen Reactions   Nortriptyline Other (See Comments)    confusion    Sertraline Other (See Comments)    confusion    Duloxetine Other (See Comments)    Cant remember unknown    Gabapentin Other (See Comments)    Muscle weakess- pt states she fell   Codeine Rash    Current Facility-Administered Medications  Medication Dose Route Frequency Provider Last Rate Last Dose   0.9 %  sodium chloride infusion   Intravenous Continuous Harrie Foreman, MD 100 mL/hr at 01/01/19 1055     acetaminophen (TYLENOL) tablet 650 mg  650 mg Oral Q6H PRN Harrie Foreman, MD   650 mg at 12/31/18 1933   Or   acetaminophen (TYLENOL) suppository 650 mg  650 mg Rectal Q6H PRN Harrie Foreman, MD       calcium-vitamin D (OSCAL WITH D) 500-200 MG-UNIT per tablet   Oral Daily Henreitta Leber, MD   1 tablet at 01/01/19 1004   cholecalciferol (VITAMIN D3) tablet 2,000 Units  2,000 Units Oral Daily Henreitta Leber, MD   2,000 Units at 01/01/19 1004   diclofenac sodium (VOLTAREN) 1 % transdermal gel 4 g  4 g Topical QID Henreitta Leber, MD   4 g at 01/01/19 1009   docusate sodium (COLACE) capsule 100 mg  100 mg Oral BID Harrie Foreman, MD   100 mg at 01/01/19 1004   ferrous sulfate tablet 325 mg  325 mg Oral Daily Henreitta Leber, MD   325 mg at 01/01/19 1004   labetalol (NORMODYNE) tablet 200 mg  200 mg Oral TID Houston Siren, MD   200 mg at 01/01/19 1004   levothyroxine (SYNTHROID) tablet 75 mcg  75 mcg Oral Q0600 Houston Siren, MD   75 mcg at 01/01/19 0646   lisinopril (ZESTRIL) tablet 5 mg  5 mg Oral Daily Houston Siren, MD   5 mg at 01/01/19 1004   methocarbamol (ROBAXIN) tablet 500 mg  500 mg Oral QID Houston Siren, MD   500 mg at 01/01/19 1004   ondansetron (ZOFRAN) tablet 4 mg  4 mg Oral Q6H PRN Arnaldo Natal, MD       Or   ondansetron [facility]) injection 4 mg  4 mg Intravenous Q6H PRN Arnaldo Natal, MD       pantoprazole  (PROTONIX) EC tablet 40 mg  40 mg Oral QAC breakfast Houston Siren, MD   40 mg at 01/01/19 1003   pravastatin (PRAVACHOL) tablet 40 mg  40 mg Oral QHS Houston Siren, MD   40 mg at 12/31/18 2126   predniSONE (DELTASONE) tablet 50 mg  50 mg Oral Daily Jeralyn Ruths, MD       sucralfate (CARAFATE) 1 GM/10ML suspension 1 g  1 g Oral TID WC & HS Houston Siren, MD   1 g at 01/01/19 1331   temazepam (RESTORIL) capsule 15 mg  15 mg Oral QHS PRN Pyreddy, Vivien Rota, MD       torsemide (DEMADEX) tablet 20 mg  20 mg Oral QODAY Houston Siren, MD   20 mg at 01/01/19 1004   traMADol (ULTRAM) tablet 50 mg  50 mg Oral Q6H PRN Houston Siren, MD   50 mg at 01/01/19 1033    OBJECTIVE: Vitals:   01/01/19 1005 01/01/19 1509  BP: (!) 179/76 (!) 147/74  Pulse: 69 72  Resp:  18  Temp:  97.8 F (36.6 C)  SpO2:  97%     Body mass index is 29.66 kg/m.    ECOG FS:1 - Symptomatic but completely ambulatory  General: Well-developed, well-nourished, no acute distress. Eyes: Pink conjunctiva, anicteric sclera. HEENT: Normocephalic, moist mucous membranes. Lungs: Clear to auscultation bilaterally. Heart: Regular rate and rhythm. No rubs, murmurs, or gallops. Abdomen: Soft, nontender, nondistended. No organomegaly noted, normoactive bowel sounds. Musculoskeletal: No edema, cyanosis, or clubbing. Neuro: Alert, answering all questions appropriately. Cranial nerves grossly intact. Skin: No rashes or petechiae noted. Psych: Normal affect.   LAB RESULTS:  Lab Results  Component Value Date   NA 141 12/31/2018   K 3.5 12/31/2018   CL 108 12/31/2018   CO2 25 12/31/2018   GLUCOSE 111 (H) 12/31/2018   BUN 22 12/31/2018   CREATININE 1.09 (H) 12/31/2018   CALCIUM 8.6 (L) 12/31/2018   PROT 6.6 12/31/2018   ALBUMIN 3.7 12/31/2018   AST 15 12/31/2018   ALT 10 12/31/2018   ALKPHOS 85 12/31/2018   BILITOT 0.6 12/31/2018   GFRNONAA 47 (L) 12/31/2018   GFRAA 55 (L) 12/31/2018    Lab Results   Component Value Date   WBC 4.3 01/01/2019   NEUTROABS 6.2 12/31/2018   HGB 10.6 (L) 01/01/2019   HCT 33.7 (L) 01/01/2019   MCV 93.6 01/01/2019   PLT 25 (LL) 01/01/2019     STUDIES: Ct Abdomen Pelvis W Contrast  Result Date: 12/31/2018 CLINICAL DATA:  Epigastric abdominal pain. EXAM: CT ABDOMEN AND PELVIS WITH CONTRAST TECHNIQUE: Multidetector CT imaging of the abdomen and pelvis was performed using the standard protocol following bolus administration  of intravenous contrast. CONTRAST:  100mL OMNIPAQUE IOHEXOL 300 MG/ML  SOLN COMPARISON:  Radiographs earlier this day. Right upper quadrant ultrasound 12/14/2018. CT 05/01/2016 FINDINGS: Lower chest: Minor lung base atelectasis. Decreased size of hiatal hernia from prior exam, no small in size. Upper normal heart size. Hepatobiliary: No focal liver abnormality is seen. No gallstones, gallbladder wall thickening, or biliary dilatation. Pancreas: No ductal dilatation or inflammation. Spleen: Normal in size without focal abnormality. Adrenals/Urinary Tract: Normal adrenal glands. No hydronephrosis or perinephric edema. Homogeneous renal enhancement with symmetric excretion on delayed phase imaging. Mild bilateral renal parenchymal thinning. Urinary bladder is physiologically distended without wall thickening. Tiny focus of air in the urinary bladder may be iatrogenic. Stomach/Bowel: Reduction in size of previous large hiatal hernia, postsurgical change the gastroesophageal junction. Small residual hiatal hernia. Majority of the stomach is now intra-abdominal. No small bowel wall thickening, obstruction, or inflammation. Normal appendix. Moderate colonic stool burden. Colonic diverticulosis, prominent in the sigmoid colon. No diverticulitis. Vascular/Lymphatic: Aorto bi-iliac atherosclerosis. No aneurysm. Prominent 7 mm right external iliac node is nonspecific. No enlarged lymph nodes in the abdomen or pelvis. Reproductive: Uterus is surgically absent.  Ovaries tentatively identified and quiescent. No adnexal mass. Other: No free air, free fluid, or intra-abdominal fluid collection. Musculoskeletal: Degenerative anterolisthesis of L4 on L5 has slightly increased from 2018 CT. Degenerative change in the right hip. Unchanged T9 vertebral body hemangioma. No acute osseous abnormalities. IMPRESSION: 1. No acute abnormality or explanation for abdominal pain. 2. Colonic diverticulosis without diverticulitis. Moderate colonic stool burden. 3. Prior hiatal hernia repair, small residual or recurrent hiatal hernia. Aortic Atherosclerosis (ICD10-I70.0). Electronically Signed   By: Narda RutherfordMelanie  Sanford M.D.   On: 12/31/2018 03:39   Dg Abdomen Acute W/chest  Result Date: 12/31/2018 CLINICAL DATA:  Epigastric pain. EXAM: DG ABDOMEN ACUTE W/ 1V CHEST COMPARISON:  Report from chest radiograph 05/10/2009, images not available. FINDINGS: Small metallic densities at the left lung base with adjacent linear densities, presumably scarring and chronic. No acute airspace disease. Heart is normal in size with normal mediastinal contours. No pleural effusion or pulmonary edema. No free intra-abdominal air. No bowel dilatation to suggest obstruction. Small volume of colonic stool. Multiple pelvic calcifications are most consistent with phleboliths. No evidence of radiopaque calculi. Mild scoliosis. Osteoarthritis of the right hip. IMPRESSION: 1. Left lung base scarring. 2. Normal bowel gas pattern. No free air. Electronically Signed   By: Narda RutherfordMelanie  Sanford M.D.   On: 12/31/2018 02:25    ASSESSMENT: Thrombocytopenia, abdominal pain.  PLAN:    1.  Thrombocytopenia: Patient reports this is not a new diagnosis and plan was to have a work-up as an [place] [facility].  This appointment is not until January 14, 2019 [facility].  Have asked nursing to call their office and move the appointment to next week.  In the meantime, all of patient's  laboratory work is either negative or within normal limits except for her declining platelet count.  It is possible this could be ITP and patient was given 50 mg prednisone daily starting today.  I will see her in follow-up on Friday, January 03, 2019 for repeat platelet count and to determine whether to continue prednisone treatment.  Ultimately, patient will have long-term follow-up with [facility] hematologist [facility].  2.  Abdominal pain: Unclear etiology.  CT scan did not reveal any acute abnormality or pathology. 3.  Falls: Patient has a fractured right forearm secondary to recent fall.  Continue  follow-up with orthopedics as recommended.  Appreciate consult, will follow.  Jeralyn Ruths, MD   01/01/2019 3:32 PM    '

## 2019-01-01 NOTE — Consult Note (Signed)
GI Inpatient Consult Note  Reason for Consult: Epigastric abd pain     Attending Requesting Consult: Dr. Abel Presto, MD  History of Present Illness: Donna Montes is a 84 y.o. female seen for evaluation of epigastric abd pain at the request of Dr. Verdell Carmine, MD. Pt was admitted yesterday due to severe epigastric abd pain without any associated nausea or vomiting that woke her up from sleep. Cross-sectional imaging was performed which showed no acute intra-abdominal pathology with findings of prior hiatal hernia repair, small residual or recurrent hiatal hernia. She has been seen by hematology this hospital admission for further w/u of thrombocytopenia. Dr. Grayland Ormond saw patient today and is giving patient prednisone 50 mg to treat for possibly ITP. She is scheduled to have follow-up in [facility] next week. She reports her epigastric pain is intermittent in nature and is described as burning pain which can radiate up her sternum. She denies any radiation of her abd pain across her abdomen or to her back. She reports pain is clearly exacerbated by oral intake and it doesn't matter what kinds of foods. She is not having any abdominal pain right now. There is no esophageal dysphagia or odynophagia. She reports she had pain similar to this in her 65s which she attributed to stress. She denies any sick contacts at home. She reports she is having a formed BM every 2-3 days which is dark in color. She reports she has been taking oral iron supplementation. She denies any hematochezia or BRBPR. She reports a history of GERD which has been managed with PPI daily. Over the past week her reflux and pyrosis symptoms have been worse. Patient is concerned she could have another hernia. She denies any frequent NSAID use. She reports things have been a little more stressful since she was moved to a nursing home several weeks ago. She has her two sons close by. Unknown when her last colonoscopy was performed. She does not think  she has had an EGD before. She did fracture her right wrist several weeks ago and is currently wearing a forearm cast.    Last Colonoscopy: N/A Last Endoscopy: N/A   Past Medical History:  Past Medical History:  Diagnosis Date  . Hyperlipidemia   . Hypertension   . Thyroid disease     Problem List: Patient Active Problem List   Diagnosis Date Noted  . Thrombocytopenia ([place]) 12/31/2018    Past Surgical History: Past Surgical History:  Procedure Laterality Date  . ABDOMINAL HYSTERECTOMY    . CORONARY ANGIOPLASTY WITH STENT PLACEMENT    . HERNIA REPAIR      Allergies: Allergies  Allergen Reactions  . Nortriptyline Other (See Comments)    confusion   . Sertraline Other (See Comments)    confusion   . Duloxetine Other (See Comments)    Cant remember unknown   . Gabapentin Other (See Comments)    Muscle weakess- pt states she fell  . Codeine Rash    Home Medications: Medications Prior to Admission  Medication Sig Dispense Refill Last Dose  . acetaminophen (TYLENOL) 325 MG tablet Take 325-650 mg by mouth every 6 (six) hours as needed for pain or fever.   12/30/2018 at 0815  . alendronate (FOSAMAX) 70 MG tablet Take 70 mg by mouth once a week.   Past Week at 0630  . Calcium Carbonate-Vitamin D (CALCIUM 500 + D) 500-125 MG-UNIT TABS Take 1 tablet by mouth daily.   12/30/2018 at 0900  . Cholecalciferol (VITAMIN D3) 50  MCG (2000 UT) capsule Take 2,000 Units by mouth daily.   12/30/2018 at 0900  . citalopram (CELEXA) 40 MG tablet Take 40 mg by mouth daily.   12/30/2018 at 0900  . diclofenac sodium (VOLTAREN) 1 % GEL Apply 4 g topically 4 (four) times daily. To affected joints   12/30/2018 at 2100  . ferrous sulfate 325 (65 FE) MG tablet Take 1 tablet by mouth daily.   12/30/2018 at 0900  . fluticasone (FLONASE) 50 MCG/ACT nasal spray Place 1 spray into the nose daily.   12/30/2018 at 0900  . hydrOXYzine (ATARAX/VISTARIL) 25 MG tablet Take 25-75 mg by mouth at bedtime as  needed for sleep.   12/27/2018 at 2100  . labetalol (NORMODYNE) 200 MG tablet Take 200 mg by mouth 3 (three) times daily.   12/30/2018 at 2100  . levothyroxine (SYNTHROID) 75 MCG tablet Take 75 mcg by mouth daily. Take on an empty stomach with a glass of water at least 30-60 minutes before breakfast   12/31/2018 at 0600  . lidocaine-prilocaine (EMLA) cream Apply 1 application topically daily.   12/28/2018 at 0900  . lisinopril (ZESTRIL) 5 MG tablet Take 5 mg by mouth daily.   12/30/2018 at 0900  . methocarbamol (ROBAXIN) 500 MG tablet Take 500 mg by mouth 4 (four) times daily.   12/30/2018 at 2100  . nitroGLYCERIN (NITROSTAT) 0.4 MG SL tablet Place 0.4 mg under the tongue every 5 (five) minutes as needed for chest pain.   12/31/2018 at 0020  . omeprazole (PRILOSEC) 40 MG capsule Take 40 mg by mouth daily. Take 30 minutes before breakfast   12/30/2018 at 0830  . pravastatin (PRAVACHOL) 40 MG tablet Take 40 mg by mouth at bedtime.   12/30/2018 at 2100  . senna (SENOKOT) 8.6 MG tablet Take 2 tablets by mouth 2 (two) times daily.   12/30/2018 at 1700  . torsemide (DEMADEX) 20 MG tablet Take 20 mg by mouth every other day.   12/30/2018 at 0900   Home medication reconciliation was completed with the patient.   Scheduled Inpatient Medications:   . calcium-vitamin D   Oral Daily  . cholecalciferol  2,000 Units Oral Daily  . diclofenac sodium  4 g Topical QID  . docusate sodium  100 mg Oral BID  . ferrous sulfate  325 mg Oral Daily  . labetalol  200 mg Oral TID  . levothyroxine  75 mcg Oral Q0600  . lisinopril  5 mg Oral Daily  . methocarbamol  500 mg Oral QID  . pantoprazole  40 mg Oral QAC breakfast  . pravastatin  40 mg Oral QHS  . predniSONE  50 mg Oral Daily  . sucralfate  1 g Oral TID WC & HS  . torsemide  20 mg Oral QODAY    Continuous Inpatient Infusions:   . sodium chloride 100 mL/hr at 01/01/19 1055    PRN Inpatient Medications:  acetaminophen **OR** acetaminophen, ondansetron  **OR** ondansetron (ZOFRAN) IV, temazepam, traMADol  Family History: family history includes Pancreatitis in her brother.  The patient's family history is negative for inflammatory bowel disorders, GI malignancy, or solid organ transplantation.  Social History:   reports that she has never smoked. She has never used smokeless tobacco. She reports that she does not drink alcohol. The patient denies ETOH, tobacco, or drug use.   Review of Systems: Constitutional: Weight is stable.  Eyes: No changes in vision. ENT: No oral lesions, sore throat.  GI: see HPI.  Heme/Lymph: No easy bruising.  CV: No chest pain.  GU: No hematuria.  Integumentary: No rashes.  Neuro: No headaches.  Psych: No depression/anxiety.  Endocrine: No heat/cold intolerance.  Allergic/Immunologic: No urticaria.  Resp: No cough, SOB.  Musculoskeletal: No joint swelling.    Physical Examination: BP (!) 147/74 (BP Location: Left Arm)   Pulse 72   Temp 97.8 F (36.6 C) (Oral)   Resp 18   Ht 5\' 1"  (1.549 m)   Wt 71.2 kg   SpO2 97%   BMI 29.66 kg/m  Gen: NAD, alert and oriented x 4 HEENT: PEERLA, EOMI, Neck: supple, no JVD or thyromegaly Chest: CTA bilaterally, no wheezes, crackles, or other adventitious sounds CV: RRR, no m/g/c/r Abd: soft, tender to deep palpation in epigastric region, ND, +BS in all four quadrants; no HSM, guarding, ridigity, or rebound tenderness, negative Murphy's sign Ext: no edema, well perfused with 2+ pulses, Skin: no rash or lesions noted Lymph: no LAD  Data: Lab Results  Component Value Date   WBC 4.3 01/01/2019   HGB 10.6 (L) 01/01/2019   HCT 33.7 (L) 01/01/2019   MCV 93.6 01/01/2019   PLT 25 (LL) 01/01/2019   Recent Labs  Lab 12/31/18 0141 01/01/19 0350  HGB 10.6* 10.6*   Lab Results  Component Value Date   NA 141 12/31/2018   K 3.5 12/31/2018   CL 108 12/31/2018   CO2 25 12/31/2018   BUN 22 12/31/2018   CREATININE 1.09 (H) 12/31/2018   Lab Results  Component  Value Date   ALT 10 12/31/2018   AST 15 12/31/2018   ALKPHOS 85 12/31/2018   BILITOT 0.6 12/31/2018   No results for input(s): APTT, INR, PTT in the last 168 hours. Assessment/Plan:  84 y/o Caucasian female with a PMH of hypothyroidism, HTN, HLD, and thrombocytopenia presents for epigastric abd pain  1. Epigastric abd pain 2. Thrombocytopenia - hematology following 3. Hiatal hernia - sliding vs paraesophageal  4. GERD  -Differential of epigastric abd pain consistent with etiologies such as peptic ulcer disease vs gastritis +/- H pylori. DDx also includes esophagitis, duodenitis, functional dyspepsia, biliary dyskinesia, malignancy -Continue Protonix 40 mg daily -Agree with Carafate 1g TID 1 hour before meals -We will check an H pylori stool antigen -Advise UGI series to evaluate for any stomach ulcer, mass, gastritis, paraesophageal hernia -Hemodynamically stable with no signs of overt GIB -Continue heart healthy diet. I recommend avoiding hot, spicy, greasy foods, tomato-based food products, caffeine, and chocolate to avoid gastric irritation  -We will continue to follow along -Further recommendations after UGI series   Thank you for the consult. Please call with questions or concerns.  Mickle MalloryGranville M Pryce Folts, PA-C [facility] [phone] (Cell)

## 2019-01-02 ENCOUNTER — Inpatient Hospital Stay: Payer: Medicare HMO | Admitting: Anesthesiology

## 2019-01-02 ENCOUNTER — Encounter
Admission: EM | Disposition: A | Discharge status: Home Health | Payer: Self-pay | Source: Home / Self Care | Attending: Specialist

## 2019-01-02 ENCOUNTER — Encounter: Payer: Self-pay | Admitting: Internal Medicine

## 2019-01-02 ENCOUNTER — Inpatient Hospital Stay: Payer: Medicare HMO

## 2019-01-02 HISTORY — PX: ESOPHAGOGASTRODUODENOSCOPY: SHX5428

## 2019-01-02 LAB — MULTIPLE MYELOMA PANEL, SERUM
Albumin SerPl Elph-Mcnc: 3.4 g/dL (ref 2.9–4.4)
Albumin/Glob SerPl: 1.6 (ref 0.7–1.7)
Alpha 1: 0.2 g/dL (ref 0.0–0.4)
Alpha2 Glob SerPl Elph-Mcnc: 0.6 g/dL (ref 0.4–1.0)
B-Globulin SerPl Elph-Mcnc: 0.8 g/dL (ref 0.7–1.3)
Gamma Glob SerPl Elph-Mcnc: 0.7 g/dL (ref 0.4–1.8)
Globulin, Total: 2.2 g/dL (ref 2.2–3.9)
IgA: 239 mg/dL (ref 64–422)
IgG (Immunoglobin G), Serum: 854 mg/dL (ref 586–1602)
IgM (Immunoglobulin M), Srm: 59 mg/dL (ref 26–217)
Total Protein ELP: 5.6 g/dL — ABNORMAL LOW (ref 6.0–8.5)

## 2019-01-02 LAB — CBC
HCT: 33.6 % — ABNORMAL LOW (ref 36.0–46.0)
Hemoglobin: 10.9 g/dL — ABNORMAL LOW (ref 12.0–15.0)
MCH: 29.7 pg (ref 26.0–34.0)
MCHC: 32.4 g/dL (ref 30.0–36.0)
MCV: 91.6 fL (ref 80.0–100.0)
Platelets: 34 10*3/uL — ABNORMAL LOW (ref 150–400)
RBC: 3.67 MIL/uL — ABNORMAL LOW (ref 3.87–5.11)
RDW: 14.8 % (ref 11.5–15.5)
WBC: 6.2 10*3/uL (ref 4.0–10.5)
nRBC: 0 % (ref 0.0–0.2)

## 2019-01-02 SURGERY — EGD (ESOPHAGOGASTRODUODENOSCOPY)
Anesthesia: General

## 2019-01-02 MED ORDER — PROPOFOL 10 MG/ML IV BOLUS
INTRAVENOUS | Status: DC | PRN
  Administered 2019-01-02: 50 mg via INTRAVENOUS

## 2019-01-02 MED ORDER — LIDOCAINE 2% (20 MG/ML) 5 ML SYRINGE
INTRAMUSCULAR | Status: DC | PRN
  Administered 2019-01-02: 25 mg via INTRAVENOUS

## 2019-01-02 MED ORDER — LISINOPRIL 5 MG PO TABS: 5.0000 mg | ORAL_TABLET | Freq: Once | ORAL | Status: DC

## 2019-01-02 MED ORDER — LISINOPRIL 10 MG PO TABS
10.0000 mg | ORAL_TABLET | Freq: Every day | ORAL | Status: DC
  Administered 2019-01-03: 09:00:00 10 mg via ORAL
  Filled 2019-01-02: qty 1

## 2019-01-02 MED ORDER — SODIUM CHLORIDE 0.9 % IV SOLN: INTRAVENOUS | Status: DC

## 2019-01-02 MED ORDER — HYDRALAZINE HCL 20 MG/ML IJ SOLN
10.0000 mg | INTRAMUSCULAR | Status: DC | PRN
  Administered 2019-01-02 – 2019-01-03 (×3): 10 mg via INTRAVENOUS
  Filled 2019-01-02 (×3): qty 1

## 2019-01-02 MED ORDER — MORPHINE SULFATE (PF) 2 MG/ML IV SOLN
1.0000 mg | Freq: Once | INTRAVENOUS | Status: AC
  Administered 2019-01-02: 13:00:00 1 mg via INTRAVENOUS
  Filled 2019-01-02: qty 1

## 2019-01-02 MED ORDER — PROPOFOL 500 MG/50ML IV EMUL
INTRAVENOUS | Status: DC | PRN
  Administered 2019-01-02: 100 ug/kg/min via INTRAVENOUS

## 2019-01-02 MED ORDER — PROPOFOL 10 MG/ML IV BOLUS
INTRAVENOUS | Status: AC
  Filled 2019-01-02: qty 20

## 2019-01-02 NOTE — Progress Notes (Signed)
[employer] [facility] [place] [facility]     PATIENT NAME: Donna Montes    MR#:  [id number]  DATE OF BIRTH:  Feb 11, 1935  SUBJECTIVE:   Patient's platelet count is up to 35 today.  Still complaining of some burning when she eats.  Had upper GI series this morning results are still pending.  REVIEW OF SYSTEMS:    Review of Systems  Constitutional: Negative for chills and fever.  HENT: Negative for congestion and tinnitus.   Eyes: Negative for blurred vision and double vision.  Respiratory: Negative for cough, shortness of breath and wheezing.   Cardiovascular: Negative for chest pain, orthopnea and PND.  Gastrointestinal: Negative for abdominal pain, diarrhea, nausea and vomiting.  Genitourinary: Negative for dysuria and hematuria.  Neurological: Negative for dizziness, sensory change and focal weakness.  All other systems reviewed and are negative.   Nutrition: Heart healthy Tolerating Diet: Yes Tolerating PT: Await Eval.      DRUG ALLERGIES:   Allergies  Allergen Reactions  . Nortriptyline Other (See Comments)    confusion   . Sertraline Other (See Comments)    confusion   . Duloxetine Other (See Comments)    Cant remember unknown   . Gabapentin Other (See Comments)    Muscle weakess- pt states she fell  . Codeine Rash    VITALS:  Blood pressure (!) 195/85, pulse 76, temperature 98.1 F (36.7 C), temperature source Oral, resp. rate 17, height 5\' 1"  (1.549 m), weight 76 kg, SpO2 95 %.  PHYSICAL EXAMINATION:   Physical Exam  GENERAL:  84 y.o.-year-old patient lying in bed in no acute distress.  EYES: Pupils equal, round, reactive to light and accommodation. No scleral icterus. Extraocular muscles intact.  HEENT: Head atraumatic, normocephalic. Oropharynx and nasopharynx clear.  NECK:  Supple, no jugular venous distention. No thyroid enlargement, no tenderness.  LUNGS: Normal breath sounds bilaterally, no wheezing, rales, rhonchi. No use of  accessory muscles of respiration.  CARDIOVASCULAR: S1, S2 normal. No murmurs, rubs, or gallops.  ABDOMEN: Soft, Tender in epigastric area but no rebound, rigidity, nondistended. Bowel sounds present. No organomegaly or mass.  EXTREMITIES: No cyanosis, clubbing or edema b/l.   Right upper extremity in a cast. NEUROLOGIC: Cranial nerves II through XII are intact. No focal Motor or sensory deficits b/l.  Globally weak.  PSYCHIATRIC: The patient is alert and oriented x 3.  SKIN: No obvious rash, lesion, or ulcer.    LABORATORY PANEL:   CBC Recent Labs  Lab 01/02/19 0436  WBC 6.2  HGB 10.9*  HCT 33.6*  PLT 34*   ------------------------------------------------------------------------------------------------------------------  Chemistries  Recent Labs  Lab 12/31/18 0141  NA 141  K 3.5  CL 108  CO2 25  GLUCOSE 111*  BUN 22  CREATININE 1.09*  CALCIUM 8.6*  AST 15  ALT 10  ALKPHOS 85  BILITOT 0.6   ------------------------------------------------------------------------------------------------------------------  Cardiac Enzymes No results for input(s): TROPONINI in the last 168 hours. ------------------------------------------------------------------------------------------------------------------  RADIOLOGY:  No results found.   ASSESSMENT AND PLAN:   84 year old female with past medical history of hypertension, hypothyroidism, osteoporosis, hyperlipidemia, GERD who presented to the hospital due to abdominal pain and also noted to be thrombocytopenic.  1.  Abdominal pain/Dyspepsia-etiology unclear presently. ?? Gastritis or related to her Hiatal Hernia. Patient CT abdomen pelvis was negative for acute pathology. -LFTs/lipase level normal.  Patient is afebrile, white cell count is normal. -Patient continues to have persistent symptoms and therefore gastroenterology consult obtained yesterday. -Patient underwent an  upper GI series today results of which is still pending.  -Continue PPI, Carafate, continue soft diet as tolerated. -Further plans pending upper GI series result.  2.  Thrombocytopenia- Etiology unclear. ?? ITP.   -Appreciate hematology oncology input.  Patient has been started on some high-dose steroids.  Platelet count improved from 25-34 today.  We will continue to monitor.  No acute bleeding.  3. Essential hypertension-continue lisinopril, labetalol.   4.  Hypothyroidism-continue Synthroid.  5. GERD - cont. Protonix.   6. Hyperlipidemia - cont. Pravachol.   7. Osteoporosis - cont. Calcium/vit-D supplements.   Continue physical therapy as tolerated.  Possible discharge in next 1 to 2 days.  All the records are reviewed and case discussed with Care Management/Social Worker. Management plans discussed with the patient, family and they are in agreement.  CODE STATUS: Full code  DVT Prophylaxis: Ted's & SCD's.   TOTAL TIME TAKING CARE OF THIS PATIENT: 30 minutes.   POSSIBLE D/C IN 1-2 DAYS, DEPENDING ON CLINICAL CONDITION.   Henreitta Leber M.D on 01/02/2019 at 10:53 AM  Between 7am to 6pm - Pager - [phone]  After 6pm go to [email or web address] - password [email or web address] [place]  Office  [phone]  CC: Primary care physician; Houston Siren., MD

## 2019-01-02 NOTE — H&P (View-Only) (Signed)
GI Inpatient Follow-up Note  Subjective:  Patient seen in follow-up for acute epigastric abd pain. Upper GI series performed this morning showed focal upper gastric diverticulum versus gastric ulcer. She reports her abd pain is about the same. No acute events overnight. Platelet count 34K this morning.   Scheduled Inpatient Medications:  . calcium-vitamin D   Oral Daily  . cholecalciferol  2,000 Units Oral Daily  . diclofenac sodium  4 g Topical QID  . docusate sodium  100 mg Oral BID  . ferrous sulfate  325 mg Oral Daily  . labetalol  200 mg Oral TID  . levothyroxine  75 mcg Oral Q0600  . lisinopril  5 mg Oral Daily  . methocarbamol  500 mg Oral QID  . pantoprazole  40 mg Oral QAC breakfast  . pravastatin  40 mg Oral QHS  . predniSONE  50 mg Oral Daily  . sucralfate  1 g Oral TID WC & HS  . torsemide  20 mg Oral QODAY    Continuous Inpatient Infusions:   . sodium chloride 100 mL/hr at 01/01/19 2008    PRN Inpatient Medications:  acetaminophen **OR** acetaminophen, ondansetron **OR** ondansetron (ZOFRAN) IV, temazepam, traMADol  Review of Systems: Constitutional: Weight is stable.  Eyes: No changes in vision. ENT: No oral lesions, sore throat.  GI: see HPI.  Heme/Lymph: No easy bruising.  CV: No chest pain.  GU: No hematuria.  Integumentary: No rashes.  Neuro: No headaches.  Psych: No depression/anxiety.  Endocrine: No heat/cold intolerance.  Allergic/Immunologic: No urticaria.  Resp: No cough, SOB.  Musculoskeletal: No joint swelling.    Physical Examination: BP (!) 189/90   Pulse 76   Temp 98.1 F (36.7 C) (Oral)   Resp 17   Ht 5' 1" (1.549 m)   Wt 76 kg   SpO2 95%   BMI 31.66 kg/m  Gen: NAD, alert and oriented x 4 HEENT: PEERLA, EOMI, Neck: supple, no JVD or thyromegaly Chest: CTA bilaterally, no wheezes, crackles, or other adventitious sounds CV: RRR, no m/g/c/r Abd: soft, NT, ND, +BS in all four quadrants; no HSM, guarding, ridigity, or rebound  tenderness Ext: no edema, well perfused with 2+ pulses, Skin: no rash or lesions noted Lymph: no LAD  Data: Lab Results  Component Value Date   WBC 6.2 01/02/2019   HGB 10.9 (L) 01/02/2019   HCT 33.6 (L) 01/02/2019   MCV 91.6 01/02/2019   PLT 34 (L) 01/02/2019   Recent Labs  Lab 12/31/18 0141 01/01/19 0350 01/02/19 0436  HGB 10.6* 10.6* 10.9*   Lab Results  Component Value Date   NA 141 12/31/2018   K 3.5 12/31/2018   CL 108 12/31/2018   CO2 25 12/31/2018   BUN 22 12/31/2018   CREATININE 1.09 (H) 12/31/2018   Lab Results  Component Value Date   ALT 10 12/31/2018   AST 15 12/31/2018   ALKPHOS 85 12/31/2018   BILITOT 0.6 12/31/2018   No results for input(s): APTT, INR, PTT in the last 168 hours.   UGI Series 01/02/2019: FINDINGS: This was a limited study due to the patient's clinical condition. Small sliding hiatal hernia with a slightly prominent B ring noted. Noted just below the hernia at the level of the diaphragm is a focal upper gastric diverticulum versus ulcer. Endoscopic evaluation suggested. No reflux noted. Stomach appears normal otherwise. Duodenum bulb appears normal. C-loop appears normal.  IMPRESSION: 1.  Small sliding hiatal hernia with a slightly prominent B ring.  2. Noted just   below the hernia at the level of the diaphragm is a focal upper gastric diverticulum versus ulcer. Endoscopic evaluation is suggested.    Assessment/Plan: 84 y/o Caucasian female with a PMH of hypothyroidism, HTN, HLD, and thrombocytopenia presents for epigastric abd pain  1. Epigastric abd pain 2. Thrombocytopenia - hematology following 3. Hiatal hernia - sliding vs paraesophageal  4. GERD  -UGI series this morning showed gastric diverticulum versus gastric ulcer. Due to this uncertainty, endoscopic evaluation is recommended for definitve evaluation. I discussed procedure details and indications with patient and her daughter- Lynelle Smoke who is in the room with  patient -Patient is NPO -Proceed with EGD this afternoon with Dr. Alice Reichert. See procedure note for findings and further recommendations -We will continue to follow along  I reviewed the risks (including bleeding, perforation, infection, anesthesia complications, cardiac/respiratory complications), benefits and alternatives of EGD. Patient consents to proceed.     Please call with questions or concerns.    Octavia Bruckner, PA-C [facility] [phone] (Cell)

## 2019-01-02 NOTE — Anesthesia Post-op Follow-up Note (Signed)
Anesthesia QCDR form completed.        

## 2019-01-02 NOTE — Progress Notes (Signed)
Physical Therapy Treatment Patient Details Name: Donna Montes MRN: [id number] DOB: [birth year] Today's Date: 01/02/2019    History of Present Illness Pt 84 y/o F admitted to address thrombocytopenia; pt also has c/o epigastric pain. PMH includes HTN, HLD, CAD, CHF, GERD.     PT Comments    Pt is progressing toward goals. Upon entry, pt is resting in bed; states she would be okay to perform PT. RN called to move saline drip to IV pole to start appointment. This date, pt performs bed mobility, transfers, and ambulation with supervision/CGA and use of SPC. Pt will continue to benefit from skilled PT to address deficits in strength, mobility, and balance.    Follow Up Recommendations  Home health PT     Equipment Recommendations  None recommended by PT    Recommendations for Other Services       Precautions / Restrictions Precautions Precautions: None Restrictions Weight Bearing Restrictions: Yes Other Position/Activity Restrictions: Pt reports NWB on R UE    Mobility  Bed Mobility Overal bed mobility: Needs Assistance Bed Mobility: Sit to Supine       Sit to supine: Supervision   General bed mobility comments: Pt able to perform bed mobility using bed rails; lean toward R side less significant. CGA when pt scooting toward EOB  Transfers Overall transfer level: Needs assistance Equipment used: None Transfers: Sit to/from Stand Sit to Stand: Min guard         General transfer comment: Pt able to perform sit to stand without use of AD; pt is more stable in standing with AD to prevent LOB  Ambulation/Gait Ambulation/Gait assistance: Min guard Gait Distance (Feet): 200 Feet Assistive device: Straight cane Gait Pattern/deviations: Step-through pattern;Decreased stride length;Narrow base of support     General Gait Details: Pt able to perform amb with reduced stride length and narrow BOS. Pt able to move quickly and without LOB   Stairs             Wheelchair  Mobility    Modified Rankin (Stroke Patients Only)       Balance Overall balance assessment: History of Falls;Needs assistance Sitting-balance support: Single extremity supported;Feet supported Sitting balance-Leahy Scale: Fair Sitting balance - Comments: Trunk lean less evident   Standing balance support: Single extremity supported Standing balance-Leahy Scale: Good Standing balance comment: Pt able to stand and amb with use of SPC. Able to stand at some points without UE support to change diaper                            Cognition Arousal/Alertness: Awake/alert Behavior During Therapy: WFL for tasks assessed/performed Overall Cognitive Status: Within Functional Limits for tasks assessed                                        Exercises Other Exercises Other Exercises: Supine, 10 reps, bilateral: AP, HS, SLR, resisted hip ad/ab; PT supervision Other Exercises: Pt maintained upright balance in standing and sitting for extended period of time to change diaper    General Comments        Pertinent Vitals/Pain Pain Assessment: 0-10 Pain Score: 4  Pain Location: epigastric region Pain Descriptors / Indicators: Aching Pain Intervention(s): Monitored during session;Limited activity within patient's tolerance    Home Living  Prior Function            PT Goals (current goals can now be found in the care plan section) Acute Rehab PT Goals Patient Stated Goal: to return home PT Goal Formulation: With patient Time For Goal Achievement: 01/15/19 Potential to Achieve Goals: Good Progress towards PT goals: Progressing toward goals    Frequency    Min 2X/week      PT Plan Current plan remains appropriate    Co-evaluation              AM-PAC PT "6 Clicks" Mobility   Outcome Measure  Help needed turning from your back to your side while in a flat bed without using bedrails?: A Little Help needed moving  from lying on your back to sitting on the side of a flat bed without using bedrails?: A Little Help needed moving to and from a bed to a chair (including a wheelchair)?: A Little Help needed standing up from a chair using your arms (e.g., wheelchair or bedside chair)?: A Little Help needed to walk in hospital room?: A Little Help needed climbing 3-5 steps with a railing? : A Little 6 Click Score: 18    End of Session Equipment Utilized During Treatment: Gait belt Activity Tolerance: Patient tolerated treatment well Patient left: in chair;with call bell/phone within reach;with nursing/sitter in room;with family/visitor present Nurse Communication: Mobility status PT Visit Diagnosis: Muscle weakness (generalized) (M62.81);History of falling (Z91.81);Pain Pain - Right/Left: (epigastric pain)     Time: 8921-1941 PT Time Calculation (min) (ACUTE ONLY): 40 min  Charges:                        Donna Montes, SPT    Donna Montes 01/02/2019, 4:39 PM

## 2019-01-02 NOTE — Transfer of Care (Signed)
Immediate Anesthesia Transfer of Care Note  Patient: Cristy Colmenares  Procedure(s) Performed: ESOPHAGOGASTRODUODENOSCOPY (EGD) (N/A )  Patient Location: Endoscopy Unit  Anesthesia Type:General  Level of Consciousness: awake  Airway & Oxygen Therapy: Patient Spontanous Breathing and Patient connected to nasal cannula oxygen  Post-op Assessment: Report given to RN and Post -op Vital signs reviewed and stable  Post vital signs: Reviewed  Last Vitals:  Vitals Value Taken Time  BP 163/77 01/02/19 1426  Temp 36.3 C 01/02/19 1426  Pulse 74 01/02/19 1426  Resp 16 01/02/19 1426  SpO2 93 % 01/02/19 1426  Vitals shown include unvalidated device data.  Last Pain:  Vitals:   01/02/19 1425  TempSrc: Tympanic  PainSc: Asleep      Patients Stated Pain Goal: 0 (04/23/13 5208)  Complications: No apparent anesthesia complications

## 2019-01-02 NOTE — Interval H&P Note (Signed)
History and Physical Interval Note:  01/02/2019 1:25 PM  Donna Montes  has presented today for surgery, with the diagnosis of Abdominal pain, UGI bleed, anemia.  The various methods of treatment have been discussed with the patient and family. After consideration of risks, benefits and other options for treatment, the patient has consented to  Procedure(s): ESOPHAGOGASTRODUODENOSCOPY (EGD) (N/A) as a surgical intervention.  The patient's history has been reviewed, patient examined, no change in status, stable for surgery.  I have reviewed the patient's chart and labs.  Questions were answered to the patient's satisfaction.     [place], [place]

## 2019-01-02 NOTE — TOC Initial Note (Signed)
Transition of Care [facility]) - Initial/Assessment Note    Patient Details  Name: Donna Montes MRN: [id number] Date of Birth: Nov 16, 1934  Transition of [facility]) CM/SW Contact:    Shelbie Hutching, RN Phone Number: 01/02/2019, 9:41 AM  Clinical Narrative:                 Patient admitted to the hospital with thrombocytopenia.  Patient was at [facility] for rehab after having a fall and fracturing her wrist.  Patient was at [facility] for about 2 weeks and was scheduled for discharge from rehab today.  Patient will not return to [facility] and will discharge home.   Patient lives in [place] by herself and is independent at baseline.  Patient has a walker.  When patient is discharged from the hospital she will go to her granddaughter's home in [place] until Saturday.  On Saturday her daughter Lynelle Smoke will take the patient back to her home in [place].  Tammy lives in [place] but the patient has 2 sons that are in this area that will be able to check in on her.  Home Health will be arranged with [facility].  Home health referral given to [facility] with Alvis Lemmings for RN, PT, OT, and aide.  Plan is to discharge today.  Patient does not need any additional DME at discharge.    Expected Discharge Plan: [place] Barriers to Discharge: Continued Medical Work up   Patient Goals and CMS Choice Patient states their goals for this hospitalization and ongoing recovery are:: to go home to her grandaughters house with home health CMS Medicare.gov Compare Post Acute Care list provided to:: Patient Choice offered to / list presented to : Patient  Expected Discharge Plan and Services Expected Discharge Plan: [place]   Discharge Planning Services: CM Consult Post Acute Care Choice: [place] arrangements for the past 2 months: Single Family Home                           HH Arranged: RN, PT, OT, Nurse's Aide [place] Agency: [place] Date [facility] Agency  Contacted: 01/02/19 Time HH Agency Contacted: [phone] Representative spoke with at [place]: Tommi Rumps  Prior Living Arrangements/Services Living arrangements for the past 2 months: [place] with:: Self Patient language and need for interpreter reviewed:: No Do you feel safe going back to the place where you live?: Yes      Need for Family Participation in Patient Care: Yes (Comment)(help with mobility, supervision) Care giver support system in place?: Yes (comment)(daughter and Granddaughter)   Criminal Activity/Legal Involvement Pertinent to Current Situation/Hospitalization: No - Comment as needed  Activities of Daily Living Home Assistive Devices/Equipment: Cane (specify quad or straight) ADL Screening (condition at time of admission) Patient's cognitive ability adequate to safely complete daily activities?: Yes Is the patient deaf or have difficulty hearing?: Yes Does the patient have difficulty seeing, even when wearing glasses/contacts?: No Does the patient have difficulty concentrating, remembering, or making decisions?: No Patient able to express need for assistance with ADLs?: Yes Does the patient have difficulty dressing or bathing?: Yes Independently performs ADLs?: No Communication: Needs assistance Is this a change from baseline?: Pre-admission baseline Dressing (OT): Needs assistance Is this a change from baseline?: Pre-admission baseline Grooming: Needs assistance Is this a change from baseline?: Pre-admission baseline Feeding: Needs assistance Is this a change from baseline?: Pre-admission baseline Bathing: Needs assistance Is  this a change from baseline?: Pre-admission baseline Toileting: Needs assistance Is this a change from baseline?: Pre-admission baseline In/Out Bed: Needs assistance Is this a change from baseline?: Pre-admission baseline Walks in Home: Needs assistance Is this a change from baseline?: Pre-admission baseline Does the patient  have difficulty walking or climbing stairs?: Yes Weakness of Legs: Both Weakness of Arms/Hands: None  Permission Sought/Granted Permission sought to share information with : Case Manager, Family Supports, Other (comment) Permission granted to share information with : Yes, Verbal Permission Granted     Permission granted to share info w AGENCY: [facility]  Permission granted to share info w Relationship: Daughter Tammy     Emotional Assessment Appearance:: Appears stated age Attitude/Demeanor/Rapport: Engaged Affect (typically observed): Accepting Orientation: : Oriented to Self, Oriented to Place, Oriented to  Time, Oriented to Situation Alcohol / Substance Use: Not Applicable Psych Involvement: No (comment)  Admission diagnosis:  Malignant hypertension [I10] Paraesophageal hernia [K44.9] Epigastric abdominal pain [R10.13] Acute pancreatitis without infection or necrosis, unspecified pancreatitis type [K85.90] Patient Active Problem List   Diagnosis Date Noted  . Thrombocytopenia (HCC) 12/31/2018   PCP:  Lester [place]., MD Pharmacy:   [facility], [place] - [address] [place] [address] Phone: [phone] Fax: [phone]     Social Determinants of Health (SDOH) Interventions    Readmission Risk Interventions No flowsheet data found.

## 2019-01-02 NOTE — Progress Notes (Signed)
GI Inpatient Follow-up Note  Subjective:  Patient seen in follow-up for acute epigastric abd pain. Upper GI series performed this morning showed focal upper gastric diverticulum versus gastric ulcer. She reports her abd pain is about the same. No acute events overnight. Platelet count 34K this morning.   Scheduled Inpatient Medications:  . calcium-vitamin D   Oral Daily  . cholecalciferol  2,000 Units Oral Daily  . diclofenac sodium  4 g Topical QID  . docusate sodium  100 mg Oral BID  . ferrous sulfate  325 mg Oral Daily  . labetalol  200 mg Oral TID  . levothyroxine  75 mcg Oral Q0600  . lisinopril  5 mg Oral Daily  . methocarbamol  500 mg Oral QID  . pantoprazole  40 mg Oral QAC breakfast  . pravastatin  40 mg Oral QHS  . predniSONE  50 mg Oral Daily  . sucralfate  1 g Oral TID WC & HS  . torsemide  20 mg Oral QODAY    Continuous Inpatient Infusions:   . sodium chloride 100 mL/hr at 01/01/19 2008    PRN Inpatient Medications:  acetaminophen **OR** acetaminophen, ondansetron **OR** ondansetron (ZOFRAN) IV, temazepam, traMADol  Review of Systems: Constitutional: Weight is stable.  Eyes: No changes in vision. ENT: No oral lesions, sore throat.  GI: see HPI.  Heme/Lymph: No easy bruising.  CV: No chest pain.  GU: No hematuria.  Integumentary: No rashes.  Neuro: No headaches.  Psych: No depression/anxiety.  Endocrine: No heat/cold intolerance.  Allergic/Immunologic: No urticaria.  Resp: No cough, SOB.  Musculoskeletal: No joint swelling.    Physical Examination: BP (!) 189/90   Pulse 76   Temp 98.1 F (36.7 C) (Oral)   Resp 17   Ht 5\' 1"  (1.549 m)   Wt 76 kg   SpO2 95%   BMI 31.66 kg/m  Gen: NAD, alert and oriented x 4 HEENT: PEERLA, EOMI, Neck: supple, no JVD or thyromegaly Chest: CTA bilaterally, no wheezes, crackles, or other adventitious sounds CV: RRR, no m/g/c/r Abd: soft, NT, ND, +BS in all four quadrants; no HSM, guarding, ridigity, or rebound  tenderness Ext: no edema, well perfused with 2+ pulses, Skin: no rash or lesions noted Lymph: no LAD  Data: Lab Results  Component Value Date   WBC 6.2 01/02/2019   HGB 10.9 (L) 01/02/2019   HCT 33.6 (L) 01/02/2019   MCV 91.6 01/02/2019   PLT 34 (L) 01/02/2019   Recent Labs  Lab 12/31/18 0141 01/01/19 0350 01/02/19 0436  HGB 10.6* 10.6* 10.9*   Lab Results  Component Value Date   NA 141 12/31/2018   K 3.5 12/31/2018   CL 108 12/31/2018   CO2 25 12/31/2018   BUN 22 12/31/2018   CREATININE 1.09 (H) 12/31/2018   Lab Results  Component Value Date   ALT 10 12/31/2018   AST 15 12/31/2018   ALKPHOS 85 12/31/2018   BILITOT 0.6 12/31/2018   No results for input(s): APTT, INR, PTT in the last 168 hours.   UGI Series 01/02/2019: FINDINGS: This was a limited study due to the patient's clinical condition. Small sliding hiatal hernia with a slightly prominent B ring noted. Noted just below the hernia at the level of the diaphragm is a focal upper gastric diverticulum versus ulcer. Endoscopic evaluation suggested. No reflux noted. Stomach appears normal otherwise. Duodenum bulb appears normal. C-loop appears normal.  IMPRESSION: 1.  Small sliding hiatal hernia with a slightly prominent B ring.  2. Noted just  below the hernia at the level of the diaphragm is a focal upper gastric diverticulum versus ulcer. Endoscopic evaluation is suggested.    Assessment/Plan: 84 y/o Caucasian female with a PMH of hypothyroidism, HTN, HLD, and thrombocytopenia presents for epigastric abd pain  1. Epigastric abd pain 2. Thrombocytopenia - hematology following 3. Hiatal hernia - sliding vs paraesophageal  4. GERD  -UGI series this morning showed gastric diverticulum versus gastric ulcer. Due to this uncertainty, endoscopic evaluation is recommended for definitve evaluation. I discussed procedure details and indications with patient and her daughter- Lynelle Smoke who is in the room with  patient -Patient is NPO -Proceed with EGD this afternoon with Dr. Alice Reichert. See procedure note for findings and further recommendations -We will continue to follow along  I reviewed the risks (including bleeding, perforation, infection, anesthesia complications, cardiac/respiratory complications), benefits and alternatives of EGD. Patient consents to proceed.     Please call with questions or concerns.    Octavia Bruckner, PA-C [facility] [phone] (Cell)

## 2019-01-02 NOTE — Op Note (Signed)
[facility] Patient Name: Donna Montes Procedure Date: 01/02/2019 2:08 PM MRN: [id number] Account #: [id number] Date of Birth: 1934/07/08 Admit Type: Inpatient Age: 84 Room: [facility] ENDO ROOM 1 Gender: Female Note Status: Finalized Procedure:            Upper GI endoscopy Indications:          Epigastric abdominal pain, Odynophagia, Suspected                        esophageal reflux Providers:            Boykin Nearing. Norma Fredrickson MD, MD Referring MD:         Lester [place] (Referring MD) Medicines:            Propofol per Anesthesia Complications:        No immediate complications. Estimated blood loss:                        Minimal. Procedure:            Pre-Anesthesia Assessment:                       - The risks and benefits of the procedure and the                        sedation options and risks were discussed with the                        patient. All questions were answered and informed                        consent was obtained.                       - Patient identification and proposed procedure were                        verified prior to the procedure by the nurse. The                        procedure was verified in the procedure room.                       - ASA Grade Assessment: III - A patient with severe                        systemic disease.                       - After reviewing the risks and benefits, the patient                        was deemed in satisfactory condition to undergo the                        procedure.                       After obtaining informed consent, the endoscope was                        passed under direct  vision. Throughout the procedure,                        the patient's blood pressure, pulse, and oxygen                        saturations were monitored continuously. The Endoscope                        was introduced through the mouth, and advanced to the                        third part of  duodenum. The upper GI endoscopy was                        accomplished without difficulty. The patient tolerated                        the procedure well. Findings:      Multiple plaques were found in the distal esophagus.      LA Grade B (one or more mucosal breaks greater than 5 mm, not extending       between the tops of two mucosal folds) esophagitis with bleeding was       found in the distal esophagus. Biopsy is contraindicated because the       patient has thrombocytopenia. No biopsies or other specimens were       collected for this exam. Estimated blood loss was minimal. Bleeding from       esophagitis was self-limited and associated with mild scope trauma.       Estimated blood loss was minimal.      A moderate-sized area of extrinsic compression was found at the lower       esophageal sphincter. There was resistance to passage of scope into the       stomach due to this extrinsic compression presumably due to hiatal       hernia repair/fundoplication of the stomach.      Evidence of a Nissen fundoplication was found in the cardia. The wrap       appeared very tight. This was traversed.      The examined duodenum was normal.      The exam was otherwise without abnormality.      There is no endoscopic evidence of ulceration, diverticula or fistula in       the gastric fundus and in the gastric body.      These pertinent negative findings in the stomach do not support findings       noted on UGI series. Impression:           - Multiple plaques in the distal esophagus.                       - LA Grade B erosive esophagitis. Biopsy is                        contraindicated. No specimens collected.                       - Extrinsic compression at the lower esophageal  sphincter.                       - A Nissen fundoplication was found. The wrap appears                        very tight.                       - Normal examined duodenum.                        - The examination was otherwise normal. Recommendation:       - Mechanical soft diet.                       - Use sucralfate suspension 1 gram PO QID.                       - Return patient to hospital ward for ongoing care.                       - The findings and recommendations were discussed with                        the patient and their family. Procedure Code(s):    --- Professional ---                       [phone], Esophagogastroduodenoscopy, flexible, transoral;                        diagnostic, including collection of specimen(s) by                        brushing or washing, when performed (separate procedure) Diagnosis Code(s):    --- Professional ---                       R13.10, Dysphagia, unspecified                       R10.13, Epigastric pain                       Z98.890, Other specified postprocedural states                       K22.2, Esophageal obstruction                       K20.8, Other esophagitis                       K22.8, Other specified diseases of esophagus CPT copyright 2019 American Medical Association. All rights reserved. The codes documented in this report are preliminary and upon coder review may  be revised to meet current compliance requirements. Stanton Kidneyeodoro K Toledo MD, MD 01/02/2019 2:32:53 PM This report has been signed electronically. Number of Addenda: 0 Note Initiated On: 01/02/2019 2:08 PM Estimated Blood Loss: Estimated blood loss was minimal. Estimated blood loss                        was minimal.      [facility]

## 2019-01-02 NOTE — Progress Notes (Signed)
PT Cancel Note:  Pt is currently out of room, not available for therapy at this time. Will re-attempt, time permitting.  Greggory Stallion, PT, DPT [phone]

## 2019-01-02 NOTE — Anesthesia Preprocedure Evaluation (Addendum)
Anesthesia Evaluation  Patient identified by MRN, date of birth, ID band Patient awake    Reviewed: Allergy & Precautions, H&P , NPO status , reviewed documented beta blocker date and time   Airway Mallampati: III  TM Distance: <3 FB Neck ROM: limited    Dental  (+) Edentulous Upper, Edentulous Lower   Pulmonary    Pulmonary exam normal        Cardiovascular hypertension, Normal cardiovascular exam  06/2018 Stress Test Summary  Both Stress and rest images demonstrate a homogenous pattern of  radiopharmaceutical uptake in all walls in all views.  Gated imaging shows Normal wall motion with an ejection fraction of 78%  There is no evidence of reversible ischemia on this study.   Neuro/Psych    GI/Hepatic   Endo/Other    Renal/GU      Musculoskeletal   Abdominal   Peds  Hematology  (+) Blood dyscrasia, , Severe thrombocytopenia. Discussed w endoscopist, team, and pt/daughter   Anesthesia Other Findings Past Medical History: No date: Hyperlipidemia No date: Hypertension No date: Thyroid disease  Past Surgical History: No date: ABDOMINAL HYSTERECTOMY No date: CORONARY ANGIOPLASTY WITH STENT PLACEMENT No date: HERNIA REPAIR  BMI    Body Mass Index: 31.66 kg/m      Reproductive/Obstetrics                          Anesthesia Physical Anesthesia Plan  ASA: III  Anesthesia Plan: General   Post-op Pain Management:    Induction: Intravenous  PONV Risk Score and Plan: Treatment may vary due to age or medical condition and TIVA  Airway Management Planned: Nasal Cannula and Natural Airway  Additional Equipment:   Intra-op Plan:   Post-operative Plan:   Informed Consent: I have reviewed the patients History and Physical, chart, labs and discussed the procedure including the risks, benefits and alternatives for the proposed anesthesia with the patient or authorized representative  who has indicated his/her understanding and acceptance.     Dental Advisory Given  Plan Discussed with: CRNA  Anesthesia Plan Comments:         Anesthesia Quick Evaluation

## 2019-01-03 ENCOUNTER — Inpatient Hospital Stay: Payer: Medicare HMO | Admitting: Oncology

## 2019-01-03 ENCOUNTER — Inpatient Hospital Stay: Payer: Medicare HMO

## 2019-01-03 LAB — CBC
HCT: 33.8 % — ABNORMAL LOW (ref 36.0–46.0)
Hemoglobin: 10.8 g/dL — ABNORMAL LOW (ref 12.0–15.0)
MCH: 29.7 pg (ref 26.0–34.0)
MCHC: 32 g/dL (ref 30.0–36.0)
MCV: 92.9 fL (ref 80.0–100.0)
Platelets: 57 10*3/uL — ABNORMAL LOW (ref 150–400)
RBC: 3.64 MIL/uL — ABNORMAL LOW (ref 3.87–5.11)
RDW: 15.5 % (ref 11.5–15.5)
WBC: 6.2 10*3/uL (ref 4.0–10.5)
nRBC: 0 % (ref 0.0–0.2)

## 2019-01-03 MED ORDER — LISINOPRIL 10 MG PO TABS: 10.0000 mg | ORAL_TABLET | Freq: Every day | ORAL | 1 refills | Status: AC

## 2019-01-03 MED ORDER — SENNOSIDES-DOCUSATE SODIUM 8.6-50 MG PO TABS
1.0000 | ORAL_TABLET | Freq: Two times a day (BID) | ORAL | Status: DC
  Administered 2019-01-03: 1 via ORAL
  Filled 2019-01-03: qty 1

## 2019-01-03 MED ORDER — PREDNISONE 50 MG PO TABS: 50.0000 mg | ORAL_TABLET | Freq: Every day | ORAL | 0 refills | Status: AC

## 2019-01-03 MED ORDER — SUCRALFATE 1 GM/10ML PO SUSP: 1.0000 g | Freq: Three times a day (TID) | ORAL | 0 refills | Status: AC

## 2019-01-03 NOTE — Progress Notes (Signed)
Dr. Verdell Carmine reports that pt is ready to be D/C today with [facility] services. Contacted Cory at [employer] and informed him that pt is going home today and she is staying with her granddaughter today and she is returning to her home on Saturday.

## 2019-01-03 NOTE — Discharge Summary (Signed)
[facility] - [place] at [facility]   PATIENT NAME: Donna Montes    MR#:  [id number]  DATE OF BIRTH:  10-30-1934  DATE OF ADMISSION:  12/31/2018 ADMITTING PHYSICIAN: Arnaldo NatalMichael S Diamond, MD  DATE OF DISCHARGE: 01/03/2019  PRIMARY CARE PHYSICIAN: Lester CarolinaMcFadden, John C., MD    ADMISSION DIAGNOSIS:  Malignant hypertension [I10] Paraesophageal hernia [K44.9] Epigastric abdominal pain [R10.13] Acute pancreatitis without infection or necrosis, unspecified pancreatitis type [K85.90]  DISCHARGE DIAGNOSIS:  Active Problems:   Thrombocytopenia (HCC)   SECONDARY DIAGNOSIS:   Past Medical History:  Diagnosis Date  . Hyperlipidemia   . Hypertension   . Thyroid disease     HOSPITAL COURSE:   84 year old female with past medical history of hypertension, hypothyroidism, osteoporosis, hyperlipidemia, GERD who presented to the hospital due to abdominal pain and also noted to be thrombocytopenic.  1.  Abdominal pain/Dyspepsia-this was secondary to esophagitis/previous hiatal hernia surgery. -Patient was treated supportively with PPI but was not improving and therefore underwent an upper GI series which showed a possible diverticulum/esophageal ulcer.  A gastroenterology consult was obtained and patient underwent a endoscopy which showed evidence of previous surgery for hiatal hernia and a Nissen fundoplication. -Patient had grade B esophagitis and GI presently recommends continuing PPI and Carafate. -Patient will continue a full liquid diet and use soft diet as needed.  She will continue meds as mentioned above with outpatient follow-up with gastroenterology. -LFTs/lipase level normal.  Patient was afebrile, white cell count is normal.  2.  Thrombocytopenia-patient presented to the hospital with a platelet count of 25, a hematology oncology consult was obtained. -Patient had noted subacute bleeding.  She underwent serologic work-up which was essentially benign.  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 -After being started on oral prednisone patient's platelet count has improved from 25-57 on the day of discharge.  She has notice of acute bleeding will continue follow-up with hematology oncology as an outpatient.  Patient was referred to a hematologist at [facility] and was advised to keep that appointment as an outpatient.  3. Essential hypertension- pt. Will continue lisinopril, labetalol.  - BP was a bit elevated so her Lisinopril dose was advanced prior to discharge.   4.  Hypothyroidism- she will continue Synthroid.  5. GERD - she will cont. Her Omeprazole and Carafate.    6. Hyperlipidemia - she will cont. Pravachol.   7. Osteoporosis - she will cont. Calcium/vit-D supplements.   Patient is doing be discharged home with home health physical therapy.  Patient's daughter is in agreement with this plan.  DISCHARGE CONDITIONS:   Stable.   CONSULTS OBTAINED:  Treatment Team:  Stanton Kidneyoledo, Teodoro K, MD  DRUG ALLERGIES:   Allergies  Allergen Reactions  . Nortriptyline Other (See Comments)    confusion   . Sertraline Other (See Comments)    confusion   . Duloxetine Other (See Comments)    Cant remember unknown   . Gabapentin Other (See Comments)    Muscle weakess- pt states she fell  . Codeine Rash    DISCHARGE MEDICATIONS:   Allergies as of 01/03/2019      Reactions   Nortriptyline Other (See Comments)   confusion   Sertraline Other (See Comments)   confusion   Duloxetine Other (See Comments)   Cant remember unknown   Gabapentin Other (See Comments)   Muscle weakess- pt states she fell   Codeine Rash      Medication  List    TAKE these medications   acetaminophen 325 MG tablet Commonly known as: TYLENOL Take 325-650 mg by mouth every 6 (six) hours as needed for pain or fever.   alendronate 70 MG tablet Commonly known as: FOSAMAX Take 70 mg by mouth once a  week.   Calcium 500 + D 500-125 MG-UNIT Tabs Generic drug: Calcium Carbonate-Vitamin D Take 1 tablet by mouth daily.   citalopram 40 MG tablet Commonly known as: CELEXA Take 40 mg by mouth daily.   diclofenac sodium 1 % Gel Commonly known as: VOLTAREN Apply 4 g topically 4 (four) times daily. To affected joints   ferrous sulfate 325 (65 FE) MG tablet Take 1 tablet by mouth daily.   fluticasone 50 MCG/ACT nasal spray Commonly known as: FLONASE Place 1 spray into the nose daily.   hydrOXYzine 25 MG tablet Commonly known as: ATARAX/VISTARIL Take 25-75 mg by mouth at bedtime as needed for sleep.   labetalol 200 MG tablet Commonly known as: NORMODYNE Take 200 mg by mouth 3 (three) times daily.   levothyroxine 75 MCG tablet Commonly known as: SYNTHROID Take 75 mcg by mouth daily. Take on an empty stomach with a glass of water at least 30-60 minutes before breakfast   lidocaine-prilocaine cream Commonly known as: EMLA Apply 1 application topically daily.   lisinopril 10 MG tablet Commonly known as: ZESTRIL Take 1 tablet (10 mg total) by mouth daily. What changed:   medication strength  how much to take   methocarbamol 500 MG tablet Commonly known as: ROBAXIN Take 500 mg by mouth 4 (four) times daily.   nitroGLYCERIN 0.4 MG SL tablet Commonly known as: NITROSTAT Place 0.4 mg under the tongue every 5 (five) minutes as needed for chest pain.   omeprazole 40 MG capsule Commonly known as: PRILOSEC Take 40 mg by mouth daily. Take 30 minutes before breakfast   pravastatin 40 MG tablet Commonly known as: PRAVACHOL Take 40 mg by mouth at bedtime.   predniSONE 50 MG tablet Commonly known as: DELTASONE Take 1 tablet (50 mg total) by mouth daily with breakfast for 4 days.   senna 8.6 MG tablet Commonly known as: SENOKOT Take 2 tablets by mouth 2 (two) times daily.   sucralfate 1 GM/10ML suspension Commonly known as: CARAFATE Take 10 mLs (1 g total) by mouth 4  (four) times daily -  with meals and at bedtime.   torsemide 20 MG tablet Commonly known as: DEMADEX Take 20 mg by mouth every other day.   Vitamin D3 50 MCG (2000 UT) capsule Take 2,000 Units by mouth daily.         DISCHARGE INSTRUCTIONS:   DIET:  Full liquids and advance to soft diet as tolerated.   DISCHARGE CONDITION:  Stable  ACTIVITY:  Activity as tolerated  OXYGEN:  Home Oxygen: No.   Oxygen Delivery: room air  DISCHARGE LOCATION:  Home with Home Health PT   If you experience worsening of your admission symptoms, develop shortness of breath, life threatening emergency, suicidal or homicidal thoughts you must seek medical attention immediately by calling 911 or calling your MD immediately  if symptoms less severe.  You Must read complete instructions/literature along with all the possible adverse reactions/side effects for all the Medicines you take and that have been prescribed to you. Take any new Medicines after you have completely understood and accpet all the possible adverse reactions/side effects.   Please note  You were cared for by a hospitalist during your  hospital stay. If you have any questions about your discharge medications or the care you received while you were in the hospital after you are discharged, you can call the unit and asked to speak with the hospitalist on call if the hospitalist that took care of you is not available. Once you are discharged, your primary care physician will handle any further medical issues. Please note that NO REFILLS for any discharge medications will be authorized once you are discharged, as it is imperative that you return to your primary care physician (or establish a relationship with a primary care physician if you do not have one) for your aftercare needs so that they can reassess your need for medications and monitor your lab values.     Today   Patient still having some difficulty swallowing and vomited some  of her eggs this morning.  She has underlying severe esophagitis based on endoscopy yesterday.  Advised patient and also patient's daughter to place her on full liquid diet and Ensure supplements and advance to soft as tolerated.  Patient was treated for her vomiting with as needed Zofran.  She will be discharged today on PPI and Carafate with outpatient follow-up with gastroenterology and also hematology oncology.  Patient's platelet count has improved with oral steroids and will be discharged on oral steroids.  VITAL SIGNS:  Blood pressure (!) 154/67, pulse 71, temperature 98.2 F (36.8 C), temperature source Oral, resp. rate 20, height 5\' 1"  (1.549 m), weight 72 kg, SpO2 97 %.  I/O:    Intake/Output Summary (Last 24 hours) at 01/03/2019 1213 Last data filed at 01/03/2019 0958 Gross per 24 hour  Intake 300 ml  Output 2900 ml  Net -2600 ml    PHYSICAL EXAMINATION:   GENERAL:  84 y.o.-year-old patient lying in bed in no acute distress.  EYES: Pupils equal, round, reactive to light and accommodation. No scleral icterus. Extraocular muscles intact.  HEENT: Head atraumatic, normocephalic. Oropharynx and nasopharynx clear.  NECK:  Supple, no jugular venous distention. No thyroid enlargement, no tenderness.  LUNGS: Normal breath sounds bilaterally, no wheezing, rales, rhonchi. No use of accessory muscles of respiration.  CARDIOVASCULAR: S1, S2 normal. No murmurs, rubs, or gallops.  ABDOMEN: Soft, Tender in epigastric area but no rebound, rigidity, nondistended. Bowel sounds present. No organomegaly or mass.  EXTREMITIES: No cyanosis, clubbing or edema b/l.   Right upper extremity in a cast. NEUROLOGIC: Cranial nerves II through XII are intact. No focal Motor or sensory deficits b/l.  Globally weak.  PSYCHIATRIC: The patient is alert and oriented x 3.  SKIN: No obvious rash, lesion, or ulcer.    DATA REVIEW:   CBC Recent Labs  Lab 01/03/19 0742  WBC 6.2  HGB 10.8*  HCT 33.8*  PLT  57*    Chemistries  Recent Labs  Lab 12/31/18 0141  NA 141  K 3.5  CL 108  CO2 25  GLUCOSE 111*  BUN 22  CREATININE 1.09*  CALCIUM 8.6*  AST 15  ALT 10  ALKPHOS 85  BILITOT 0.6    Cardiac Enzymes No results for input(s): TROPONINI in the last 168 hours.  Microbiology Results  Results for orders placed or performed during the hospital encounter of 12/31/18  SARS CORONAVIRUS 2 (TAT 6-24 HRS) Nasopharyngeal Nasopharyngeal Swab     Status: None   Collection Time: 12/31/18  7:51 AM   Specimen: Nasopharyngeal Swab  Result Value Ref Range Status   SARS Coronavirus 2 NEGATIVE NEGATIVE Final  Comment: (NOTE) SARS-CoV-2 target nucleic acids are NOT DETECTED. The SARS-CoV-2 RNA is generally detectable in upper and lower respiratory specimens during the acute phase of infection. Negative results do not preclude SARS-CoV-2 infection, do not rule out co-infections with other pathogens, and should not be used as the sole basis for treatment or other patient management decisions. Negative results must be combined with clinical observations, patient history, and epidemiological information. The expected result is Negative. Fact Sheet for Patients: SugarRoll.be Fact Sheet for Healthcare Providers: https://www.woods-mathews.com/ This test is not yet approved or cleared by the [place] FDA and  has been authorized for detection and/or diagnosis of SARS-CoV-2 by FDA under an Emergency Use Authorization (EUA). This EUA will remain  in effect (meaning this test can be used) for the duration of the COVID-19 declaration under Section 56 4(b)(1) of the Act, 21 U.S.C. section 360bbb-3(b)(1), unless the authorization is terminated or revoked sooner. Performed at [facility], [place] [address], [place], [place] [address]   MRSA PCR Screening     Status: None   Collection Time: 12/31/18  1:57 PM   Specimen: Nasal Mucosa; Nasopharyngeal   Result Value Ref Range Status   MRSA by PCR NEGATIVE NEGATIVE Final    Comment:        The GeneXpert MRSA Assay (FDA approved for NASAL specimens only), is one component of a comprehensive MRSA colonization surveillance program. It is not intended to diagnose MRSA infection nor to guide or monitor treatment for MRSA infections. Performed at [facility], [address] [place], [place] [address]     RADIOLOGY:  Frederik Schmidt Single Cm (sol Or Thin Ba)  Result Date: 01/02/2019 CLINICAL DATA:  Burning sensation.  Difficulty eating and drinking. EXAM: UPPER GI SERIES WITHOUT KUB TECHNIQUE: Routine upper GI series was performed with high density and thin barium. FLUOROSCOPY TIME:  Fluoroscopy Time:  2 minutes 36 seconds Radiation Exposure Index (if provided by the fluoroscopic device): 75.1 mGy COMPARISON:  CT 12/31/2018. FINDINGS: This was a limited study due to the patient's clinical condition. Small sliding hiatal hernia with a slightly prominent B ring noted. Noted just below the hernia at the level of the diaphragm is a focal upper gastric diverticulum versus ulcer. Endoscopic evaluation suggested. No reflux noted. Stomach appears normal otherwise. Duodenum bulb appears normal. C-loop appears normal. IMPRESSION: 1.  Small sliding hiatal hernia with a slightly prominent B ring. 2. Noted just below the hernia at the level of the diaphragm is a focal upper gastric diverticulum versus ulcer. Endoscopic evaluation is suggested. Electronically Signed   By: Marcello Moores  Register   On: 01/02/2019 11:22      Management plans discussed with the patient, family and they are in agreement.  CODE STATUS:     Code Status Orders  (From admission, onward)         Start     Ordered   12/31/18 0848  Full code  Continuous     12/31/18 0847         TOTAL TIME TAKING CARE OF THIS PATIENT: 40 minutes.    Henreitta Leber M.D on 01/03/2019 at 12:13 PM  Between 7am to 6pm - Pager -  [phone]  After 6pm go to [email or web address] - password [email or web address] [facility]  Office  [phone]  CC: Primary care physician; Houston Siren., MD

## 2019-01-03 NOTE — Care Management Important Message (Signed)
Important Message  Patient Details  Name: Donna Montes MRN: [id number] Date of Birth: [birth year]   Medicare Important Message Given:  Yes     Juliann Pulse A Sareena Odeh 01/03/2019, 10:57 AM

## 2019-01-03 NOTE — Progress Notes (Signed)
Recieved MD  order to discharge patient to home, reviewed homes meds, prescriptions , follow up appointments and discharge instructions with patient and patient verbalized understanding.

## 2019-01-06 NOTE — Anesthesia Postprocedure Evaluation (Signed)
Anesthesia Post Note  Patient: Donna Montes  Procedure(s) Performed: ESOPHAGOGASTRODUODENOSCOPY (EGD) (N/A )  Patient location during evaluation: Endoscopy Anesthesia Type: General Level of consciousness: awake and alert Pain management: pain level controlled Vital Signs Assessment: post-procedure vital signs reviewed and stable Respiratory status: spontaneous breathing, nonlabored ventilation and respiratory function stable Cardiovascular status: blood pressure returned to baseline and stable Postop Assessment: no apparent nausea or vomiting Anesthetic complications: no     Last Vitals:  Vitals:   01/03/19 1500 01/03/19 1734  BP: (!) 148/73 140/61  Pulse:  100  Resp:  18  Temp:  36.7 C  SpO2:  95%    Last Pain:  Vitals:   01/03/19 1734  TempSrc: Oral  PainSc:                  Alphonsus Sias

## 2020-04-11 IMAGING — RF DG UGI W SINGLE CM
13 of 14 series · 14 of 15 positions shown · non-contrast
Comparison: CT 12/31/2018.

CLINICAL DATA: Burning sensation.  Difficulty eating and drinking.

EXAM:
UPPER GI SERIES WITHOUT KUB
TECHNIQUE: Routine upper GI series was performed with high density and thin
barium.
FLUOROSCOPY TIME:  Fluoroscopy Time:  2 minutes 36 seconds
Radiation Exposure Index (if provided by the fluoroscopic device):
75.1 mGy

[Series 1: fluoro_barium 2fps_bw · 0.18mm/px · 1 of 1 slices shown (1 of 13)]
[im 1/1]
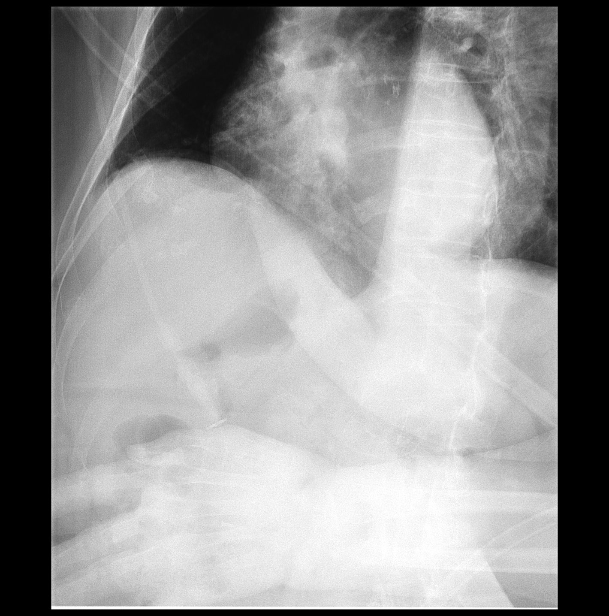

[Series 2: fluoro_barium 2fps_bw · 0.18mm/px · 1 of 1 slices shown (2 of 13)]
[im 1/1]
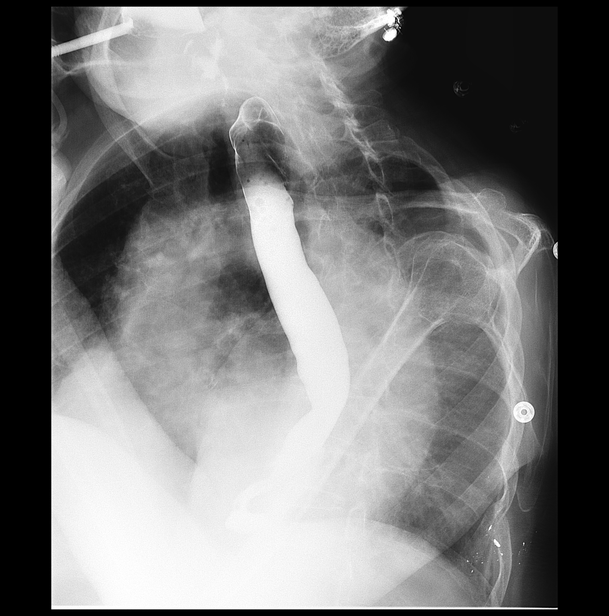

[Series 3: fluoro_barium 2fps_bw · 0.18mm/px · 1 of 1 slices shown (3 of 13)]
[im 1/1]
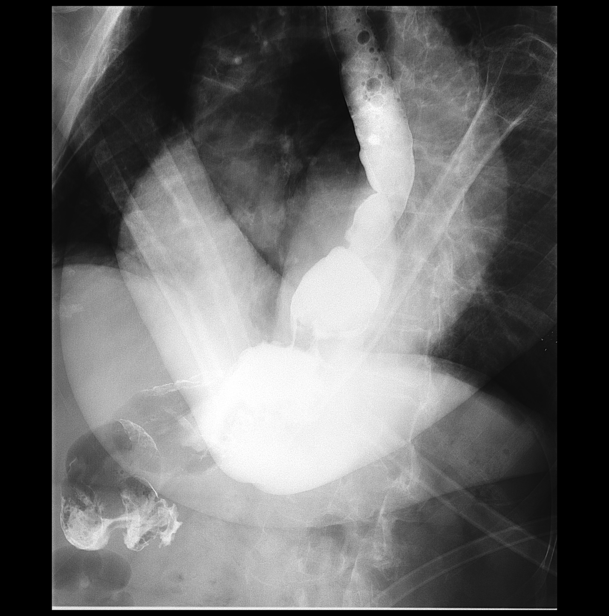

[Series 4: fluoro_barium 2fps_bw · 0.18mm/px · 1 of 1 slices shown (4 of 13)]
[im 1/1]
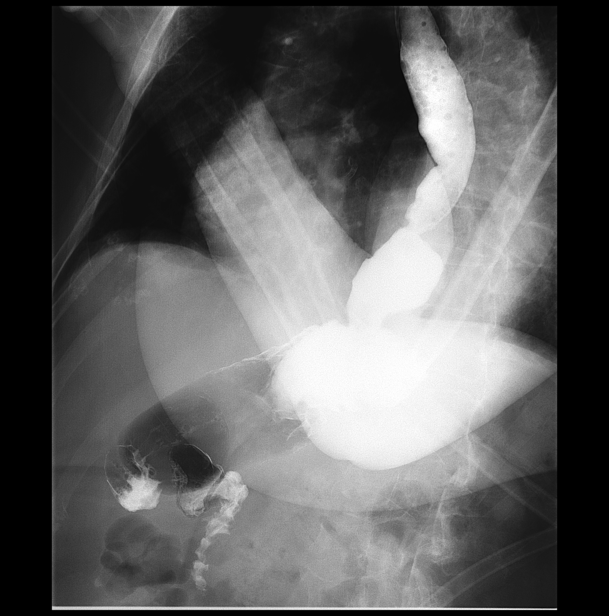

[Series 5: fluoro_barium 2fps_bw · 0.18mm/px · 1 of 1 slices shown (5 of 13)]
[im 1/1]
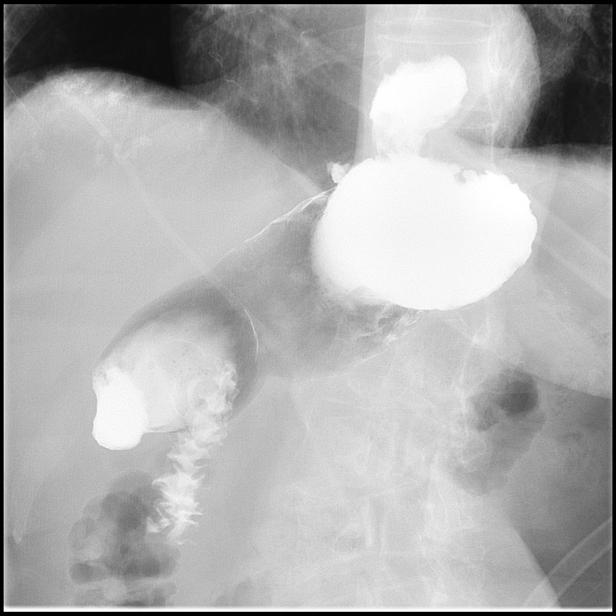

[Series 6: fluoro_barium 2fps_bw · 0.18mm/px · 1 of 1 slices shown (6 of 13)]
[im 1/1]
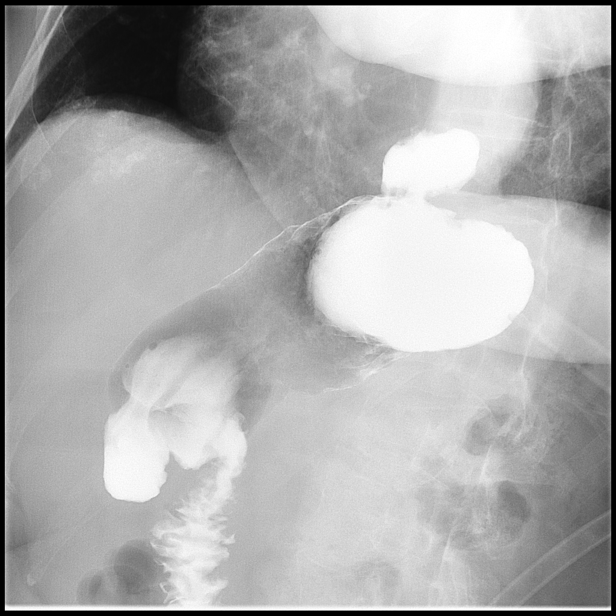

[Series 7: fluoro_barium 2fps_bw · 0.18mm/px · 1 of 1 slices shown (7 of 13)]
[im 1/1]
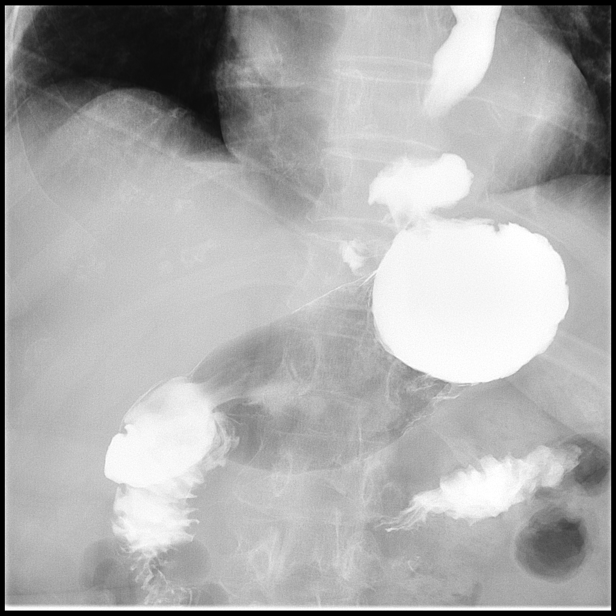

[Series 9: fluoro_barium 2fps_bw · 0.18mm/px · 1 of 1 slices shown (8 of 13)]
[im 1/1]
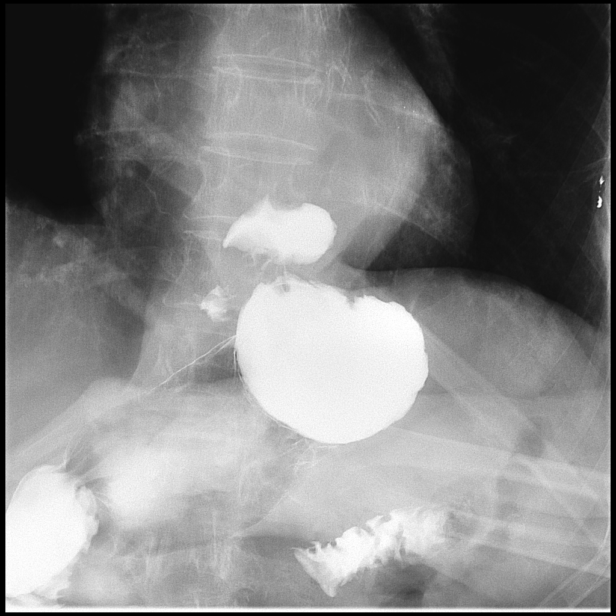

[Series 10: fluoro_barium 2fps_bw · 0.18mm/px · 1 of 1 slices shown (9 of 13)]
[im 1/1]
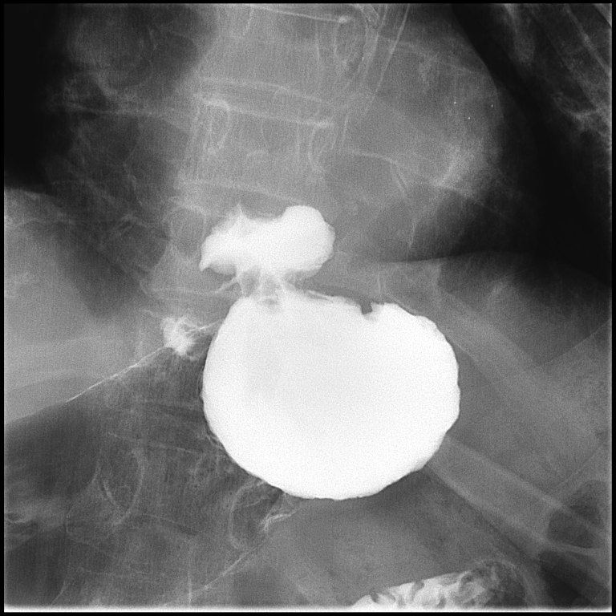

[Series 11: fluoro_barium 2fps_bw · 0.18mm/px · 2 of 2 frames shown (10 of 13)]
[frame 1/2]
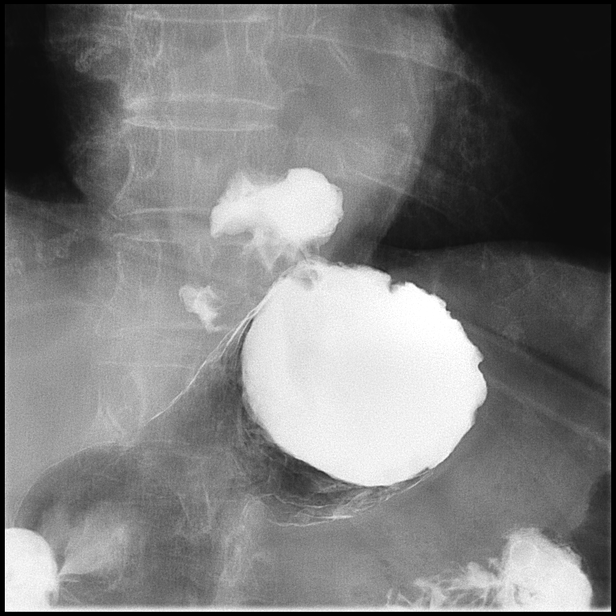
[frame 2/2]
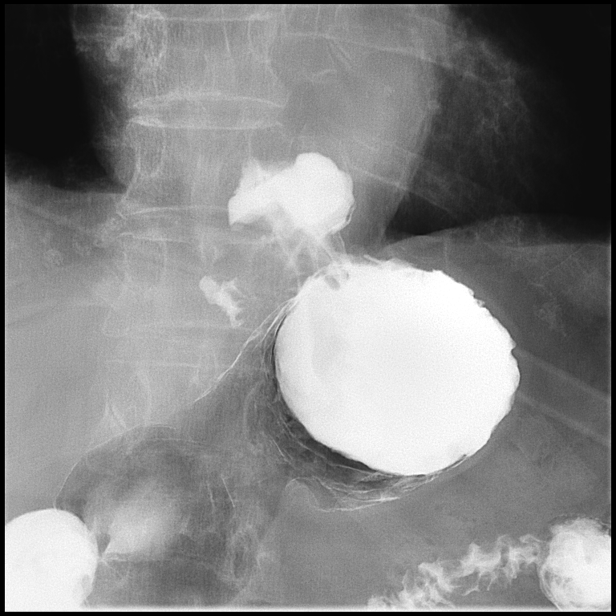

[Series 12: fluoro_barium 2fps_bw · 0.18mm/px · 1 of 1 slices shown (11 of 13)]
[im 1/1]
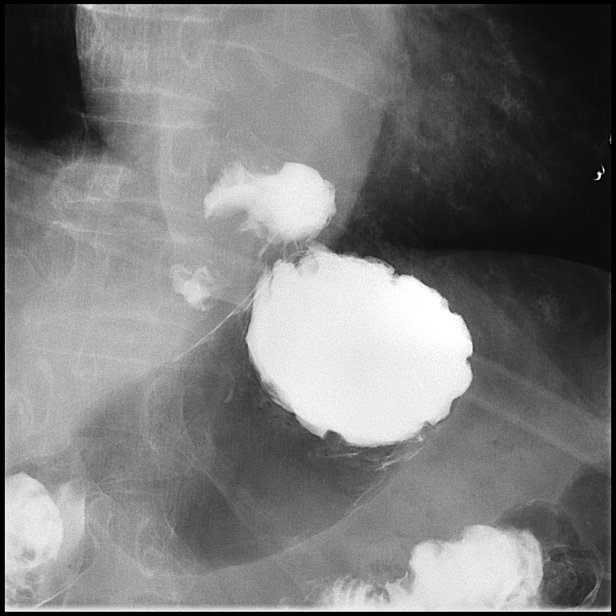

[Series 13: fluoro_barium 2fps_bw · 0.18mm/px · 1 of 1 slices shown (12 of 13)]
[im 1/1]
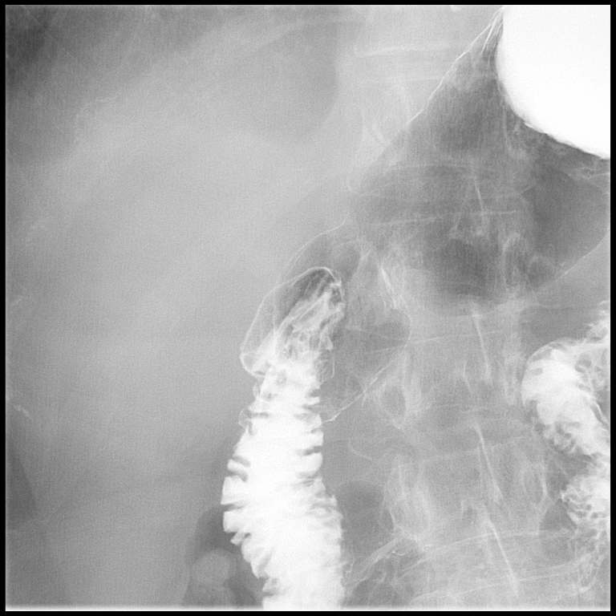

[Series 14: fluoro_barium 2fps_bw · 0.18mm/px · 1 of 1 slices shown (13 of 13)]
[im 1/1]
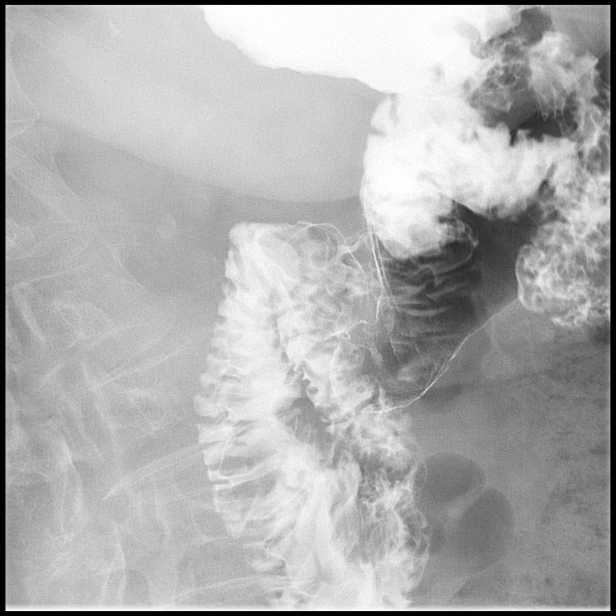

[14 of 15 positions shown; findings below may reference images not displayed]

FINDINGS: This was a limited study due to the patient's clinical condition.
Small sliding hiatal hernia with a slightly prominent B ring noted.
Noted just below the hernia at the level of the diaphragm is a focal
upper gastric diverticulum versus ulcer. Endoscopic evaluation
suggested. No reflux noted. Stomach appears normal otherwise.
Duodenum bulb appears normal. C-loop appears normal.
IMPRESSION: 1.  Small sliding hiatal hernia with a slightly prominent B ring.

2. Noted just below the hernia at the level of the diaphragm is a
focal upper gastric diverticulum versus ulcer. Endoscopic evaluation
is suggested.
